# Patient Record
Sex: Female | Born: 1960 | Race: Black or African American | Hispanic: No | Marital: Married | Smoking: Never smoker
Health system: Southern US, Community
[De-identification: ages and names within clinical notes are randomized; demographics above are authoritative.]

## PROBLEM LIST (undated history)

## (undated) DIAGNOSIS — G43909 Migraine, unspecified, not intractable, without status migrainosus: Secondary | ICD-10-CM

## (undated) DIAGNOSIS — R51 Headache: Secondary | ICD-10-CM

## (undated) DIAGNOSIS — J45909 Unspecified asthma, uncomplicated: Secondary | ICD-10-CM

## (undated) DIAGNOSIS — C73 Malignant neoplasm of thyroid gland: Secondary | ICD-10-CM

## (undated) DIAGNOSIS — E7800 Pure hypercholesterolemia, unspecified: Secondary | ICD-10-CM

## (undated) DIAGNOSIS — E785 Hyperlipidemia, unspecified: Secondary | ICD-10-CM

## (undated) DIAGNOSIS — F411 Generalized anxiety disorder: Secondary | ICD-10-CM

## (undated) DIAGNOSIS — E042 Nontoxic multinodular goiter: Secondary | ICD-10-CM

## (undated) HISTORY — DX: Malignant neoplasm of thyroid gland: C73

## (undated) HISTORY — DX: Generalized anxiety disorder: F41.1

## (undated) HISTORY — DX: Nontoxic multinodular goiter: E04.2

## (undated) HISTORY — DX: Migraine, unspecified, not intractable, without status migrainosus: G43.909

## (undated) HISTORY — DX: Pure hypercholesterolemia, unspecified: E78.00

## (undated) HISTORY — DX: Headache: R51

## (undated) HISTORY — DX: Hyperlipidemia, unspecified: E78.5

---

## 1999-05-04 ENCOUNTER — Inpatient Hospital Stay (HOSPITAL_COMMUNITY): Admission: AD | Admit: 1999-05-04 | Discharge: 1999-05-04 | Payer: Self-pay | Admitting: Obstetrics and Gynecology

## 1999-05-04 ENCOUNTER — Encounter: Payer: Self-pay | Admitting: Obstetrics and Gynecology

## 1999-05-17 ENCOUNTER — Inpatient Hospital Stay (HOSPITAL_COMMUNITY): Admission: RE | Admit: 1999-05-17 | Discharge: 1999-05-20 | Payer: Self-pay | Admitting: Obstetrics and Gynecology

## 1999-06-17 ENCOUNTER — Encounter: Payer: Self-pay | Admitting: Obstetrics and Gynecology

## 1999-06-19 ENCOUNTER — Inpatient Hospital Stay (HOSPITAL_COMMUNITY): Admission: AD | Admit: 1999-06-19 | Discharge: 1999-06-25 | Payer: Self-pay | Admitting: Obstetrics and Gynecology

## 1999-08-07 ENCOUNTER — Inpatient Hospital Stay (HOSPITAL_COMMUNITY): Admission: AD | Admit: 1999-08-07 | Discharge: 1999-08-07 | Payer: Self-pay | Admitting: Obstetrics and Gynecology

## 1999-09-06 ENCOUNTER — Inpatient Hospital Stay (HOSPITAL_COMMUNITY): Admission: AD | Admit: 1999-09-06 | Discharge: 1999-09-06 | Payer: Self-pay | Admitting: Obstetrics and Gynecology

## 1999-09-08 ENCOUNTER — Inpatient Hospital Stay (HOSPITAL_COMMUNITY): Admission: AD | Admit: 1999-09-08 | Discharge: 1999-09-08 | Payer: Self-pay | Admitting: Obstetrics and Gynecology

## 1999-10-03 ENCOUNTER — Inpatient Hospital Stay (HOSPITAL_COMMUNITY): Admission: AD | Admit: 1999-10-03 | Discharge: 1999-10-03 | Payer: Self-pay | Admitting: Obstetrics & Gynecology

## 1999-11-06 ENCOUNTER — Inpatient Hospital Stay (HOSPITAL_COMMUNITY): Admission: AD | Admit: 1999-11-06 | Discharge: 1999-11-09 | Payer: Self-pay | Admitting: Obstetrics and Gynecology

## 1999-12-24 ENCOUNTER — Other Ambulatory Visit: Admission: RE | Admit: 1999-12-24 | Discharge: 1999-12-24 | Payer: Self-pay | Admitting: Obstetrics and Gynecology

## 2001-02-25 ENCOUNTER — Other Ambulatory Visit: Admission: RE | Admit: 2001-02-25 | Discharge: 2001-02-25 | Payer: Self-pay | Admitting: Obstetrics and Gynecology

## 2002-03-10 ENCOUNTER — Other Ambulatory Visit: Admission: RE | Admit: 2002-03-10 | Discharge: 2002-03-10 | Payer: Self-pay | Admitting: Obstetrics and Gynecology

## 2002-04-12 ENCOUNTER — Encounter: Payer: Self-pay | Admitting: Internal Medicine

## 2002-04-12 ENCOUNTER — Encounter: Admission: RE | Admit: 2002-04-12 | Discharge: 2002-04-12 | Payer: Self-pay | Admitting: Internal Medicine

## 2003-05-10 ENCOUNTER — Encounter: Admission: RE | Admit: 2003-05-10 | Discharge: 2003-05-10 | Payer: Self-pay | Admitting: *Deleted

## 2003-05-10 ENCOUNTER — Encounter: Payer: Self-pay | Admitting: *Deleted

## 2003-06-07 ENCOUNTER — Other Ambulatory Visit: Admission: RE | Admit: 2003-06-07 | Discharge: 2003-06-07 | Payer: Self-pay | Admitting: Obstetrics and Gynecology

## 2003-07-15 ENCOUNTER — Other Ambulatory Visit: Admission: RE | Admit: 2003-07-15 | Discharge: 2003-07-15 | Payer: Self-pay | Admitting: Diagnostic Radiology

## 2003-09-14 HISTORY — PX: THYROIDECTOMY: SHX17

## 2003-09-30 ENCOUNTER — Encounter (INDEPENDENT_AMBULATORY_CARE_PROVIDER_SITE_OTHER): Payer: Self-pay | Admitting: Specialist

## 2003-09-30 ENCOUNTER — Ambulatory Visit (HOSPITAL_COMMUNITY): Admission: RE | Admit: 2003-09-30 | Discharge: 2003-10-01 | Payer: Self-pay | Admitting: General Surgery

## 2003-10-15 HISTORY — PX: TUBAL LIGATION: SHX77

## 2003-12-28 ENCOUNTER — Ambulatory Visit (HOSPITAL_COMMUNITY): Admission: RE | Admit: 2003-12-28 | Discharge: 2003-12-28 | Payer: Self-pay | Admitting: Obstetrics and Gynecology

## 2004-01-04 ENCOUNTER — Other Ambulatory Visit: Admission: RE | Admit: 2004-01-04 | Discharge: 2004-01-04 | Payer: Self-pay | Admitting: Obstetrics and Gynecology

## 2004-03-20 ENCOUNTER — Inpatient Hospital Stay (HOSPITAL_COMMUNITY): Admission: AD | Admit: 2004-03-20 | Discharge: 2004-03-20 | Payer: Self-pay | Admitting: Obstetrics and Gynecology

## 2004-03-21 ENCOUNTER — Inpatient Hospital Stay (HOSPITAL_COMMUNITY): Admission: AD | Admit: 2004-03-21 | Discharge: 2004-03-21 | Payer: Self-pay | Admitting: Obstetrics and Gynecology

## 2004-06-13 ENCOUNTER — Inpatient Hospital Stay (HOSPITAL_COMMUNITY): Admission: AD | Admit: 2004-06-13 | Discharge: 2004-06-13 | Payer: Self-pay | Admitting: Obstetrics and Gynecology

## 2004-07-25 ENCOUNTER — Inpatient Hospital Stay (HOSPITAL_COMMUNITY): Admission: RE | Admit: 2004-07-25 | Discharge: 2004-07-28 | Payer: Self-pay | Admitting: Obstetrics and Gynecology

## 2004-07-25 ENCOUNTER — Encounter (INDEPENDENT_AMBULATORY_CARE_PROVIDER_SITE_OTHER): Payer: Self-pay | Admitting: *Deleted

## 2004-09-10 ENCOUNTER — Other Ambulatory Visit: Admission: RE | Admit: 2004-09-10 | Discharge: 2004-09-10 | Payer: Self-pay | Admitting: Obstetrics and Gynecology

## 2005-01-21 ENCOUNTER — Ambulatory Visit: Payer: Self-pay | Admitting: Endocrinology

## 2005-01-23 ENCOUNTER — Ambulatory Visit: Payer: Self-pay | Admitting: Internal Medicine

## 2005-02-15 ENCOUNTER — Ambulatory Visit (HOSPITAL_COMMUNITY): Admission: RE | Admit: 2005-02-15 | Discharge: 2005-02-15 | Payer: Self-pay | Admitting: Obstetrics and Gynecology

## 2005-02-15 ENCOUNTER — Encounter (INDEPENDENT_AMBULATORY_CARE_PROVIDER_SITE_OTHER): Payer: Self-pay | Admitting: *Deleted

## 2005-09-17 ENCOUNTER — Other Ambulatory Visit: Admission: RE | Admit: 2005-09-17 | Discharge: 2005-09-17 | Payer: Self-pay | Admitting: Obstetrics and Gynecology

## 2006-02-10 ENCOUNTER — Encounter: Admission: RE | Admit: 2006-02-10 | Discharge: 2006-03-05 | Payer: Self-pay | Admitting: Specialist

## 2006-05-14 ENCOUNTER — Ambulatory Visit: Payer: Self-pay | Admitting: Endocrinology

## 2006-05-21 ENCOUNTER — Ambulatory Visit: Payer: Self-pay | Admitting: Endocrinology

## 2006-06-30 ENCOUNTER — Ambulatory Visit: Payer: Self-pay | Admitting: Endocrinology

## 2006-07-01 ENCOUNTER — Ambulatory Visit: Payer: Self-pay | Admitting: Endocrinology

## 2007-06-13 ENCOUNTER — Encounter: Payer: Self-pay | Admitting: Endocrinology

## 2007-08-12 ENCOUNTER — Ambulatory Visit: Payer: Self-pay | Admitting: Endocrinology

## 2007-08-12 LAB — CONVERTED CEMR LAB
AST: 17 units/L (ref 0–37)
Albumin: 3.6 g/dL (ref 3.5–5.2)
Alkaline Phosphatase: 50 units/L (ref 39–117)
BUN: 10 mg/dL (ref 6–23)
Basophils Relative: 0.5 % (ref 0.0–1.0)
Calcium: 8.6 mg/dL (ref 8.4–10.5)
Cholesterol: 172 mg/dL (ref 0–200)
Creatinine, Ser: 0.8 mg/dL (ref 0.4–1.2)
Eosinophils Absolute: 0.2 10*3/uL (ref 0.0–0.6)
Glucose, Bld: 101 mg/dL — ABNORMAL HIGH (ref 70–99)
HDL: 40.8 mg/dL (ref >39.0)
Iron: 39 ug/dL — ABNORMAL LOW (ref 42–145)
Ketones, ur: NEGATIVE mg/dL
Leukocytes, UA: NEGATIVE
Lymphocytes Relative: 39.3 % (ref 12.0–46.0)
MCHC: 33.7 g/dL (ref 30.0–36.0)
Monocytes Absolute: 0.3 10*3/uL (ref 0.2–0.7)
Monocytes Relative: 7.9 % (ref 3.0–11.0)
Neutro Abs: 2 10*3/uL (ref 1.4–7.7)
Potassium: 3.9 meq/L (ref 3.5–5.1)
RBC: 4.03 M/uL (ref 3.87–5.11)
RDW: 15.3 % — ABNORMAL HIGH (ref 11.5–14.6)
Sodium: 137 meq/L (ref 135–145)
Total Bilirubin: 0.7 mg/dL (ref 0.3–1.2)
Total Protein: 7.4 g/dL (ref 6.0–8.3)
Urine Glucose: NEGATIVE mg/dL
Urobilinogen, UA: 0.2 (ref 0.0–1.0)
WBC: 4.2 10*3/uL — ABNORMAL LOW (ref 4.5–10.5)

## 2007-08-19 ENCOUNTER — Encounter: Payer: Self-pay | Admitting: Endocrinology

## 2007-08-19 DIAGNOSIS — C73 Malignant neoplasm of thyroid gland: Secondary | ICD-10-CM

## 2007-08-19 DIAGNOSIS — E042 Nontoxic multinodular goiter: Secondary | ICD-10-CM | POA: Insufficient documentation

## 2007-08-19 DIAGNOSIS — E119 Type 2 diabetes mellitus without complications: Secondary | ICD-10-CM

## 2007-08-19 DIAGNOSIS — E785 Hyperlipidemia, unspecified: Secondary | ICD-10-CM

## 2007-08-19 HISTORY — DX: Nontoxic multinodular goiter: E04.2

## 2007-08-19 HISTORY — DX: Hyperlipidemia, unspecified: E78.5

## 2007-08-19 HISTORY — DX: Malignant neoplasm of thyroid gland: C73

## 2007-08-20 ENCOUNTER — Ambulatory Visit: Payer: Self-pay | Admitting: Endocrinology

## 2007-08-20 DIAGNOSIS — D509 Iron deficiency anemia, unspecified: Secondary | ICD-10-CM

## 2007-08-20 DIAGNOSIS — R51 Headache: Secondary | ICD-10-CM

## 2007-08-20 DIAGNOSIS — R519 Headache, unspecified: Secondary | ICD-10-CM | POA: Insufficient documentation

## 2007-08-20 DIAGNOSIS — R635 Abnormal weight gain: Secondary | ICD-10-CM | POA: Insufficient documentation

## 2007-08-20 DIAGNOSIS — R9431 Abnormal electrocardiogram [ECG] [EKG]: Secondary | ICD-10-CM

## 2007-08-20 HISTORY — DX: Headache: R51

## 2007-09-03 ENCOUNTER — Ambulatory Visit: Payer: Self-pay

## 2007-09-03 ENCOUNTER — Encounter: Payer: Self-pay | Admitting: Endocrinology

## 2007-09-09 ENCOUNTER — Ambulatory Visit: Payer: Self-pay

## 2007-10-01 ENCOUNTER — Telehealth (INDEPENDENT_AMBULATORY_CARE_PROVIDER_SITE_OTHER): Payer: Self-pay | Admitting: *Deleted

## 2008-07-07 ENCOUNTER — Telehealth: Payer: Self-pay | Admitting: Endocrinology

## 2008-11-08 ENCOUNTER — Ambulatory Visit: Payer: Self-pay | Admitting: Endocrinology

## 2008-11-09 LAB — CONVERTED CEMR LAB
AST: 24 units/L (ref 0–37)
Albumin: 3.9 g/dL (ref 3.5–5.2)
Alkaline Phosphatase: 47 units/L (ref 39–117)
BUN: 14 mg/dL (ref 6–23)
Basophils Absolute: 0 10*3/uL (ref 0.0–0.1)
Basophils Relative: 0.5 % (ref 0.0–3.0)
CO2: 29 meq/L (ref 19–32)
Chloride: 103 meq/L (ref 96–112)
GFR calc Af Amer: 77 mL/min
Glucose, Bld: 101 mg/dL — ABNORMAL HIGH (ref 70–99)
Hemoglobin: 11.7 g/dL — ABNORMAL LOW (ref 12.0–15.0)
Ketones, ur: NEGATIVE mg/dL
Leukocytes, UA: NEGATIVE
MCHC: 33.7 g/dL (ref 30.0–36.0)
MCV: 86.8 fL (ref 78.0–100.0)
Monocytes Absolute: 0.5 10*3/uL (ref 0.1–1.0)
Mucus, UA: NEGATIVE
Neutrophils Relative %: 52.4 % (ref 43.0–77.0)
Platelets: 222 10*3/uL (ref 150–400)
RBC / HPF: NONE SEEN
RDW: 14 % (ref 11.5–14.6)
TSH: 1.96 microintl units/mL (ref 0.35–5.50)
Total CHOL/HDL Ratio: 3.4
Total Protein, Urine: NEGATIVE mg/dL
Urine Glucose: NEGATIVE mg/dL
Urobilinogen, UA: 0.2 (ref 0.0–1.0)
VLDL: 8 mg/dL (ref 0–40)

## 2008-11-29 ENCOUNTER — Ambulatory Visit: Payer: Self-pay | Admitting: Endocrinology

## 2008-11-29 DIAGNOSIS — D500 Iron deficiency anemia secondary to blood loss (chronic): Secondary | ICD-10-CM

## 2008-12-19 ENCOUNTER — Encounter: Admission: RE | Admit: 2008-12-19 | Discharge: 2008-12-19 | Payer: Self-pay | Admitting: Endocrinology

## 2009-01-27 ENCOUNTER — Telehealth: Payer: Self-pay | Admitting: Endocrinology

## 2009-08-06 ENCOUNTER — Emergency Department (HOSPITAL_COMMUNITY): Admission: EM | Admit: 2009-08-06 | Discharge: 2009-08-06 | Payer: Self-pay | Admitting: Emergency Medicine

## 2009-12-06 ENCOUNTER — Ambulatory Visit: Payer: Self-pay | Admitting: Endocrinology

## 2009-12-06 LAB — CONVERTED CEMR LAB
AST: 19 units/L (ref 0–37)
Albumin: 3.7 g/dL (ref 3.5–5.2)
Alkaline Phosphatase: 38 units/L — ABNORMAL LOW (ref 39–117)
Basophils Relative: 0.3 % (ref 0.0–3.0)
Cholesterol: 183 mg/dL (ref 0–200)
HCT: 37.7 % (ref 36.0–46.0)
Lymphocytes Relative: 39 % (ref 12.0–46.0)
Monocytes Relative: 10.8 % (ref 3.0–12.0)
Neutro Abs: 2.3 10*3/uL (ref 1.4–7.7)
Neutrophils Relative %: 47.1 % (ref 43.0–77.0)
RBC: 4.16 M/uL (ref 3.87–5.11)
Total CHOL/HDL Ratio: 3
Total Protein: 7.6 g/dL (ref 6.0–8.3)
Triglycerides: 60 mg/dL (ref 0.0–149.0)
VLDL: 12 mg/dL (ref 0.0–40.0)

## 2009-12-11 ENCOUNTER — Telehealth: Payer: Self-pay | Admitting: Endocrinology

## 2009-12-11 ENCOUNTER — Ambulatory Visit: Payer: Self-pay | Admitting: Endocrinology

## 2009-12-11 DIAGNOSIS — M25519 Pain in unspecified shoulder: Secondary | ICD-10-CM | POA: Insufficient documentation

## 2009-12-18 ENCOUNTER — Ambulatory Visit: Payer: Self-pay | Admitting: Endocrinology

## 2009-12-26 ENCOUNTER — Ambulatory Visit: Payer: Self-pay | Admitting: Cardiology

## 2009-12-26 ENCOUNTER — Ambulatory Visit (HOSPITAL_COMMUNITY): Admission: RE | Admit: 2009-12-26 | Discharge: 2009-12-26 | Payer: Self-pay | Admitting: Endocrinology

## 2009-12-26 ENCOUNTER — Ambulatory Visit: Payer: Self-pay

## 2009-12-26 ENCOUNTER — Encounter: Payer: Self-pay | Admitting: Endocrinology

## 2010-03-09 ENCOUNTER — Telehealth: Payer: Self-pay | Admitting: Endocrinology

## 2010-05-04 ENCOUNTER — Ambulatory Visit: Payer: Self-pay | Admitting: Endocrinology

## 2010-05-04 DIAGNOSIS — E7800 Pure hypercholesterolemia, unspecified: Secondary | ICD-10-CM

## 2010-05-04 DIAGNOSIS — F411 Generalized anxiety disorder: Secondary | ICD-10-CM

## 2010-05-04 HISTORY — DX: Pure hypercholesterolemia, unspecified: E78.00

## 2010-05-04 HISTORY — DX: Generalized anxiety disorder: F41.1

## 2010-05-04 LAB — CONVERTED CEMR LAB
Alkaline Phosphatase: 45 units/L (ref 39–117)
BUN: 16 mg/dL (ref 6–23)
Bilirubin, Direct: 0.2 mg/dL (ref 0.0–0.3)
CO2: 35 meq/L — ABNORMAL HIGH (ref 19–32)
Calcium: 9 mg/dL (ref 8.4–10.5)
Chloride: 103 meq/L (ref 96–112)
Creatinine, Ser: 0.9 mg/dL (ref 0.4–1.2)
GFR calc non Af Amer: 83.95 mL/min (ref >60)
Glucose, Bld: 109 mg/dL — ABNORMAL HIGH (ref 70–99)
HDL: 47.6 mg/dL (ref >39.00)
LDL Cholesterol: 84 mg/dL (ref 0–99)
Total CHOL/HDL Ratio: 3
Total Protein: 7.4 g/dL (ref 6.0–8.3)
VLDL: 13.8 mg/dL (ref 0.0–40.0)

## 2010-05-10 ENCOUNTER — Ambulatory Visit: Payer: Self-pay | Admitting: Licensed Clinical Social Worker

## 2010-05-15 ENCOUNTER — Ambulatory Visit: Payer: Self-pay | Admitting: Licensed Clinical Social Worker

## 2010-05-21 ENCOUNTER — Ambulatory Visit: Payer: Self-pay | Admitting: Licensed Clinical Social Worker

## 2010-05-28 ENCOUNTER — Ambulatory Visit: Payer: Self-pay | Admitting: Licensed Clinical Social Worker

## 2010-06-05 ENCOUNTER — Ambulatory Visit: Payer: Self-pay | Admitting: Licensed Clinical Social Worker

## 2010-06-07 ENCOUNTER — Ambulatory Visit: Payer: Self-pay | Admitting: Licensed Clinical Social Worker

## 2010-06-14 ENCOUNTER — Encounter: Payer: Self-pay | Admitting: Endocrinology

## 2010-06-14 ENCOUNTER — Ambulatory Visit: Payer: Self-pay | Admitting: Licensed Clinical Social Worker

## 2010-06-21 ENCOUNTER — Ambulatory Visit: Payer: Self-pay | Admitting: Licensed Clinical Social Worker

## 2010-06-28 ENCOUNTER — Ambulatory Visit: Payer: Self-pay | Admitting: Licensed Clinical Social Worker

## 2010-07-02 ENCOUNTER — Telehealth (INDEPENDENT_AMBULATORY_CARE_PROVIDER_SITE_OTHER): Payer: Self-pay | Admitting: *Deleted

## 2010-07-05 ENCOUNTER — Ambulatory Visit: Payer: Self-pay | Admitting: Licensed Clinical Social Worker

## 2010-07-16 ENCOUNTER — Ambulatory Visit: Payer: Self-pay | Admitting: Licensed Clinical Social Worker

## 2010-07-24 ENCOUNTER — Ambulatory Visit: Payer: Self-pay | Admitting: Licensed Clinical Social Worker

## 2010-07-30 ENCOUNTER — Ambulatory Visit: Payer: Self-pay | Admitting: Licensed Clinical Social Worker

## 2010-08-07 ENCOUNTER — Ambulatory Visit: Payer: Self-pay | Admitting: Licensed Clinical Social Worker

## 2010-08-16 ENCOUNTER — Ambulatory Visit: Payer: Self-pay | Admitting: Licensed Clinical Social Worker

## 2010-08-22 ENCOUNTER — Ambulatory Visit: Payer: Self-pay | Admitting: Licensed Clinical Social Worker

## 2010-09-03 ENCOUNTER — Ambulatory Visit: Payer: Self-pay | Admitting: Licensed Clinical Social Worker

## 2010-11-04 ENCOUNTER — Encounter: Payer: Self-pay | Admitting: Obstetrics and Gynecology
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
## 2010-11-13 NOTE — Assessment & Plan Note (Signed)
Summary: FOLLOW UP-LB   Vital Signs:  Patient profile:   50 year old female Height:      64 inches (162.56 cm) Weight:      256.25 pounds (116.48 kg) BMI:     44.14 O2 Sat:      99 % on Room air Temp:     96.5 degrees F (35.83 degrees C) oral Pulse rate:   88 / minute BP sitting:   132 / 82  (left arm) Cuff size:   large  Vitals Entered By: Josph Macho RMA (December 11, 2009 8:09 AM)  O2 Flow:  Room air CC: Follow-up visit/ CF Is Patient Diabetic? Yes   CC:  Follow-up visit/ CF.  History of Present Illness: here for regular wellness examination.  she's feeling pretty well in general, and does not drink or smoke.   Current Medications (verified): 1)  Levoxyl 75 Mcg  Tabs (Levothyroxine Sodium) .... Take 1 By Mouth Qd 2)  Simvastatin 40 Mg Tabs (Simvastatin) .... Qhs 3)  Ketoprofen 75 Mg Caps (Ketoprofen) .... Three Times A Day As Needed Headache 4)  Hydrochlorothiazide 25 Mg Tabs (Hydrochlorothiazide) .Marland Kitchen.. 1 Daily  Allergies (verified): No Known Drug Allergies  Family History: Reviewed history from 08/20/2007 and no changes required. no cancer.  Social History: Reviewed history from 08/20/2007 and no changes required. she works in Photographer.  She is married.  Review of Systems       The patient complains of weight gain.  The patient denies fever, vision loss, decreased hearing, chest pain, syncope, prolonged cough, headaches, abdominal pain, melena, hematochezia, severe indigestion/heartburn, hematuria, suspicious skin lesions, and depression.    Physical Exam  General:  obese.  no distress  Head:  head: no deformity eyes: no periorbital swelling, no proptosis external nose and ears are normal mouth: no lesion seen Neck:  a healed scar is present.  i do not appreciate a nodule in the thyroid or elsewhere in the neck  Breasts:  sees gyn  Abdomen:  abdomen is soft, nontender.  no hepatosplenomegaly.   not distended.  no hernia  Rectal:  sees  gyn  Genitalia:  sees gyn  Msk:  muscle bulk and strength are grossly normal.  no obvious joint swelling.  gait is normal and steady there is a ganglion cyst at the right ring finger, flexor aspect, near the mcp joint Pulses:  dorsalis pedis intact bilat.  no carotid bruit  Extremities:  no deformity.  no ulcer on the feet.  feet are of normal color and temp.  no edema  Neurologic:  cn 2-12 grossly intact.   readily moves all 4's.   sensation is intact to touch on the feet  Skin:  normal texture and temp.  no rash.  not diaphoretic  Cervical Nodes:  No significant adenopathy.  Psych:  Alert and cooperative; normal mood and affect; normal attention span and concentration.   Additional Exam:  SEPARATE EVALUATION FOLLOWS--EACH PROBLEM HERE IS NEW, NOT RESPONDING TO TREATMENT, OR POSES SIGNIFICANT RISK TO THE PATIENT'S HEALTH: pt is planning to travel to the [place] later this year. she has regained the weight she lost with phentermine last year abnormal ecg is noted today. PAST MEDICAL HISTORY reviewed and up to date today REVIEW OF SYSTEMS: denies sob PHYSICAL EXAMINATION: clear to auscultation.  no respiratory distress reg rate and rhythm, no murmur LAB/XRAY RESULTS: LDL Cholesterol      [H]  621 mg/dL    ecg is reviewed IMPRESSION: dyslipidemia abnormal ecg.  she is at risk for valvular disease. travel to developing country later this year. PLAN: see instructions   Impression & Recommendations:  Problem # 1:  ROUTINE GENERAL MEDICAL EXAM@HEALTH  CARE FACL (ICD-V70.0)  Medications Added to Medication List This Visit: 1)  Hydrochlorothiazide 25 Mg Tabs (Hydrochlorothiazide) .Marland Kitchen.. 1 daily 2)  Hydrochlorothiazide 25 Mg Tabs (Hydrochlorothiazide) .... 1/2 tab daily 3)  Ondansetron Hcl 4 Mg Tabs (Ondansetron hcl) .Marland Kitchen.. 1 every 4 hrs as needed nausea 4)  Ciprofloxacin Hcl 500 Mg Tabs (Ciprofloxacin hcl) .Marland Kitchen.. 1 two times a day.  take if traveler's diarrhea  Other  Orders: EKG w/ Interpretation (93000) Echo Referral (Echo) Est. Patient Level III (02725) Est. Patient 40-64 years (36644)  Preventive Care Screening  Last Tetanus Booster:    Date:  10/14/1997    Results:  Historical      gyn is dr Rana Snare, who does mammography   Patient Instructions: 1)  start simvastatin 40 mg at bedtime. 2)  in 1 month, come in for blood tests:  lipids 272.0  liver v58.69  a1c 250.00  tsh 250.00  bmet 250.00 microalbumin 250.00 3)  tests are being ordered for you today.  a few days after the test(s), please call [phone] to hear your test results. 4)  check "echo" (an easy heart test).   5)  for your trip to the [place] later this year, here are 2 prescriptions:  cipro if you get diarrhea, and ondansetron as needed for nausea. 6)  we discussed the recommendations of the preventive services task force Prescriptions: CIPROFLOXACIN HCL 500 MG TABS (CIPROFLOXACIN HCL) 1 two times a day.  take if traveler's diarrhea  #14 x 0   Entered and Authorized by:   Minus Breeding MD   Signed by:   Minus Breeding MD on 12/11/2009   Method used:   Print then Give to Patient   RxID:   [id number] ONDANSETRON HCL 4 MG TABS (ONDANSETRON HCL) 1 every 4 hrs as needed nausea  #36 x 0   Entered and Authorized by:   Minus Breeding MD   Signed by:   Minus Breeding MD on 12/11/2009   Method used:   Print then Give to Patient   RxID:   [id number]   Preventive Care Screening  Last Tetanus Booster:    Date:  10/14/1997    Results:  Historical

## 2010-11-13 NOTE — Letter (Signed)
Summary: [facility]  [facility]   Imported By: Sherian Rein 06/25/2010 15:04:00  _____________________________________________________________________  External Attachment:    Type:   Image     Comment:   External Document

## 2010-11-13 NOTE — Progress Notes (Signed)
----   Converted from flag ---- ---- 12/11/2009 8:42 AM, Minus Breeding MD wrote: paper chart please ? tetanus date ------------------------------  Phone Note Outgoing Call   Summary of Call: Paperchart requested. Initial call taken by: Josph Macho RMA,  December 11, 2009 8:46 AM  Follow-up for Phone Call        1999 was the last tetanus. Follow-up by: Josph Macho RMA,  December 11, 2009 9:34 AM  Additional Follow-up for Phone Call Additional follow up Details #1::        i called pt.  she will come in tomorrow for td, and to pick up her prescriptions. Additional Follow-up by: Minus Breeding MD,  December 11, 2009 9:45 AM     Appended Document:  put prescriptions in the cabinet.

## 2010-11-13 NOTE — Assessment & Plan Note (Signed)
Summary: TETANUS INJ/SAE /NWS  Nurse Visit   Allergies: No Known Drug Allergies  Immunizations Administered:  Tetanus Vaccine:    Vaccine Type: Tdap    Site: left deltoid    Mfr: GlaxoSmithKline    Dose: 0.5 ml    Route: IM    Given by: Sydell Axon    Exp. Date: 01/06/2012    Lot #: [id number]    VIS given: 09/01/07 version given December 18, 2009.  Orders Added: 1)  Tdap => 50yrs IM [90715] 2)  Admin 1st Vaccine [42595]

## 2010-11-13 NOTE — Progress Notes (Signed)
  Phone Note Other Incoming   Request: Send information Summary of Call: Request for records received from [facility]. Request forwarded to [facility].

## 2010-11-13 NOTE — Progress Notes (Signed)
  Phone Note Refill Request Message from:  Fax from Pharmacy on Mar 09, 2010 3:04 PM  Refills Requested: Medication #1:  LEVOXYL 75 MCG  TABS take 1 by mouth qd   Dosage confirmed as above?Dosage Confirmed Initial call taken by: Josph Macho RMA,  Mar 09, 2010 3:05 PM    Prescriptions: LEVOXYL 75 MCG  TABS (LEVOTHYROXINE SODIUM) take 1 by mouth qd  #90 x 2   Entered by:   Josph Macho RMA   Authorized by:   Minus Breeding MD   Signed by:   Josph Macho RMA on 03/09/2010   Method used:   Electronically to        [facility] (mail-order)             ,          Ph: [phone]       Fax: [phone]   RxID:   [id number]

## 2010-11-19 ENCOUNTER — Ambulatory Visit (INDEPENDENT_AMBULATORY_CARE_PROVIDER_SITE_OTHER): Payer: 59 | Admitting: Licensed Clinical Social Worker

## 2010-11-19 DIAGNOSIS — F411 Generalized anxiety disorder: Secondary | ICD-10-CM

## 2010-11-19 DIAGNOSIS — F4310 Post-traumatic stress disorder, unspecified: Secondary | ICD-10-CM

## 2010-11-19 DIAGNOSIS — F331 Major depressive disorder, recurrent, moderate: Secondary | ICD-10-CM

## 2010-11-26 ENCOUNTER — Ambulatory Visit (INDEPENDENT_AMBULATORY_CARE_PROVIDER_SITE_OTHER): Payer: 59 | Admitting: Licensed Clinical Social Worker

## 2010-11-26 DIAGNOSIS — F331 Major depressive disorder, recurrent, moderate: Secondary | ICD-10-CM

## 2010-11-26 DIAGNOSIS — F411 Generalized anxiety disorder: Secondary | ICD-10-CM

## 2010-11-26 DIAGNOSIS — F4310 Post-traumatic stress disorder, unspecified: Secondary | ICD-10-CM

## 2010-12-07 ENCOUNTER — Ambulatory Visit: Payer: 59 | Admitting: Licensed Clinical Social Worker

## 2010-12-07 ENCOUNTER — Ambulatory Visit (INDEPENDENT_AMBULATORY_CARE_PROVIDER_SITE_OTHER): Payer: 59 | Admitting: Licensed Clinical Social Worker

## 2010-12-07 DIAGNOSIS — F4310 Post-traumatic stress disorder, unspecified: Secondary | ICD-10-CM

## 2010-12-07 DIAGNOSIS — F331 Major depressive disorder, recurrent, moderate: Secondary | ICD-10-CM

## 2010-12-07 DIAGNOSIS — F411 Generalized anxiety disorder: Secondary | ICD-10-CM

## 2010-12-17 ENCOUNTER — Ambulatory Visit (INDEPENDENT_AMBULATORY_CARE_PROVIDER_SITE_OTHER): Payer: 59 | Admitting: Licensed Clinical Social Worker

## 2010-12-17 DIAGNOSIS — F411 Generalized anxiety disorder: Secondary | ICD-10-CM

## 2010-12-17 DIAGNOSIS — F4310 Post-traumatic stress disorder, unspecified: Secondary | ICD-10-CM

## 2010-12-17 DIAGNOSIS — F331 Major depressive disorder, recurrent, moderate: Secondary | ICD-10-CM

## 2010-12-31 ENCOUNTER — Ambulatory Visit (INDEPENDENT_AMBULATORY_CARE_PROVIDER_SITE_OTHER): Payer: 59 | Admitting: Licensed Clinical Social Worker

## 2010-12-31 DIAGNOSIS — F411 Generalized anxiety disorder: Secondary | ICD-10-CM

## 2010-12-31 DIAGNOSIS — F4310 Post-traumatic stress disorder, unspecified: Secondary | ICD-10-CM

## 2010-12-31 DIAGNOSIS — F331 Major depressive disorder, recurrent, moderate: Secondary | ICD-10-CM

## 2011-01-14 ENCOUNTER — Ambulatory Visit: Payer: 59 | Admitting: Licensed Clinical Social Worker

## 2011-01-17 LAB — COMPREHENSIVE METABOLIC PANEL
AST: 24 U/L (ref 0–37)
BUN: 18 mg/dL (ref 6–23)
GFR calc Af Amer: >60 mL/min (ref >60)
Glucose, Bld: 122 mg/dL — ABNORMAL HIGH (ref 70–99)
Potassium: 3.6 mEq/L (ref 3.5–5.1)
Sodium: 136 mEq/L (ref 135–145)
Total Bilirubin: 0.9 mg/dL (ref 0.3–1.2)

## 2011-01-18 ENCOUNTER — Other Ambulatory Visit: Payer: Self-pay | Admitting: Endocrinology

## 2011-01-25 ENCOUNTER — Ambulatory Visit (INDEPENDENT_AMBULATORY_CARE_PROVIDER_SITE_OTHER): Payer: 59 | Admitting: Licensed Clinical Social Worker

## 2011-01-25 DIAGNOSIS — F4310 Post-traumatic stress disorder, unspecified: Secondary | ICD-10-CM

## 2011-01-25 DIAGNOSIS — F331 Major depressive disorder, recurrent, moderate: Secondary | ICD-10-CM

## 2011-01-25 DIAGNOSIS — F411 Generalized anxiety disorder: Secondary | ICD-10-CM

## 2011-02-19 ENCOUNTER — Ambulatory Visit (INDEPENDENT_AMBULATORY_CARE_PROVIDER_SITE_OTHER): Payer: 59 | Admitting: Licensed Clinical Social Worker

## 2011-02-19 DIAGNOSIS — F331 Major depressive disorder, recurrent, moderate: Secondary | ICD-10-CM

## 2011-02-19 DIAGNOSIS — F411 Generalized anxiety disorder: Secondary | ICD-10-CM

## 2011-02-19 DIAGNOSIS — F4310 Post-traumatic stress disorder, unspecified: Secondary | ICD-10-CM

## 2011-03-01 NOTE — Op Note (Signed)
NAME:  Miranda Dudley, Miranda Dudley                           ACCOUNT NO.:  [id number]   MEDICAL RECORD NO.:  [id number]                   PATIENT TYPE:  OIB   LOCATION:  6704                                 FACILITY:  [facility]   PHYSICIAN:  Jimmye Norman III, M.D.               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   PROCEDURE:  Left thyroid lobectomy with isthmusectomy.   SURGEON:  Jimmye Norman, M.D.   ASSISTANT:  Sharlet Salina T. Hoxworth, M.D.   ANESTHESIA:  General endotracheal anesthesia.   ESTIMATED BLOOD LOSS:  Less than 20 mL.   COMPLICATIONS:  None, condition stable.   INDICATIONS FOR PROCEDURE:  The patient is a 50 year old who had been  diagnosed with a left thyroid mass, who comes in now for lobectomy.   FINDINGS:  The patient had a 2 to 3-cm nodule on the left mid to lower pole  of her left thyroid, which on frozen section demonstrated follicular  pattern, likely hyperplasia, possible adenoma, no evidence of malignancy.   DESCRIPTION OF PROCEDURE:  The patient was taken to the operating room and  placed on the table in the supine position. After an adequate endotracheal  anesthetic was administered, a roll was placed underneath the shoulders. She  was placed in reverse Trendelenburg position. She was prepped and draped in  the usual sterile manner exposing her neck.   In the next  to last skin  fold in her neck, a suture was used to mark the  neck, and subsequently a 5-cm transverse curvilinear incision was made at  the second to last skin fold. This was taken down into the subcutaneous  tissue and then through the platysmus layer. We made a plane superiorly and  inferiorly in the  platysmus layer or just below that up to the bridging  veins from the anterior jugulars.   We subsequently dissected between the strap muscles, the sternohyoid and  the sterno hyothyroid muscles down to the layer of the thyroid. We dissected  out the left side only  but palpated the right side where no palpable masses  were noted.   We used a Green retractor  initially and then subsequent an [employer]  retractor in order to retract the muscle laterally as we exposed the lateral  and posterior aspects of the thyroid  gland. We  initially mobilized the  superior pole of the thyroid gland, getting down to superior  thyroid  vessels. We used a right-angle clamp in order to control those vessels and  subsequently place a 2-0 Vicryl tie.   We then dissected around the medial and  posterior aspect of the gland on  middle and posterior aspects, anteriorly  retracting it as we took down its  fibrous adhesions posteriorly. Once we got to the bridging middle thyroid  vessels, these were taken between  4-0 and 3-0 Vicryl ties. Clips were used  along the thyroid side of the vessels.   Inferiorly we got the inferior  thyroid vessels between  a right-angle clamp  and we tied those with a 3-0 Vicryl tie. As we had freed these up  completely,  it was only attached more posteriorly, however, we were far  away from the recurrent laryngeal nerve and had minimal difficulty with  that. We stayed very closed to the gland, making sure that we dissected away  the parathyroid inferiorly and superiorly. We also stayed  very closed to  the gland making sure that the recurrent laryngeal nerve was not involved.   As we came across the middle portion of the trachea detaching the thyroid  from it, we were able to get to just the right of the isthmus or use cautery  to come across the gland. Once we completely transected it, we cauterized  the bare edge of the thyroid gland which was not  bleeding much at all. We   then irrigated with saline and  waited for pathology prior to closure.   Once we had confirmation that this was not a malignancy or being concerned  that a complete thyroidectomy was not necessary, we closed in several  layers. The strap muscles were reapproximated using interrupted simple  stitches of 3-0 Vicryl. The platysmus layer was reapproximated using  buried  3-0 Vicryl sutures. Then the skin was closed using interrupted,  approximately five 4-0 nylon sutures with intervening Steri-Strips. A  sterile dressing was applied including a [place]'s wrap with safety pins.                                               Kathrin Ruddy, M.D.    JW/MEDQ  D:  09/30/2003  T:  10/02/2003  Job:  [id number]

## 2011-03-01 NOTE — H&P (Signed)
NAME:  Miranda Dudley, Miranda Dudley                 ACCOUNT NO.:  [id number]   MEDICAL RECORD NO.:  [id number]          PATIENT TYPE:  AMB   LOCATION:  SDC                           FACILITY:  [facility]   PHYSICIAN:  Dineen Kid. Rana Snare, M.D.    DATE OF BIRTH:  January 12, 1961   DATE OF ADMISSION:  DATE OF DISCHARGE:                                HISTORY & PHYSICAL   HISTORY OF PRESENT ILLNESS:  The patient is a 50 year old G5, P2, A3, who  had been having problems with cramping and menorrhagia, where she will  change a pad every two hours.  This has been getting worse over the last  several months.  She is getting some improvement with Motrin.  She underwent  a sonohysterogram on January 22, 2005 that showed several small fibroids, the  largest measuring 1.7 cm in size; however, it also shows a 9 mm polypoid  mass within the endometrial cavity, suspicious for a polyp.  She presents  for definitive surgical evaluation and treatment of this.   PAST MEDICAL HISTORY:  1.  Significant for history of two cervical cerclages and one abdominal      cerclage.  2.  She has had two cesarean sections including tubal ligation and removal      of the abdominal cerclage in October of 2005.   ALLERGIES:  1.  She has a LATEX sensitivity.  2.  AMPICILLIN.   MEDICATIONS:  She is on Levoxyl 75 mcg a day.   PHYSICAL EXAMINATION:  VITAL SIGNS:  Her blood pressure is 118/88.  HEART:  Regular rate and rhythm.  LUNGS:  Clear to auscultation bilaterally.  ABDOMEN:  Obese, nontender.  PELVIC:  By ultrasound the uterus is 9.4 x 3.6 x 7 cm in size with a 9 mm  polypoid mass seen within the endometrial cavity.   IMPRESSION/PLAN:  1.  Abnormal uterine bleeding.  2.  Endometrial mass consistent with a polyp.  3.  She also has intramural fibroids.   PLAN:  Hysteroscopy/D&C for evaluation and removal of this.  Risks and  benefits were discussed at length which include but not limited to, risks of  infection, bleeding, damage to uterus,  tubes, ovaries, bowel, bladder. the  possibility that this may not alleviate the bleeding or could recur.  She  does give her informed consent and wishes to proceed.      DCL/MEDQ  D:  02/14/2005  T:  02/14/2005  Job:  [id number]

## 2011-03-01 NOTE — H&P (Signed)
[facility]  Patient:    Miranda Dudley                    MRN: [id number] Adm. Date:  [id number] Attending:  Trevor Iha                         History and Physical  HISTORY OF PRESENT ILLNESS:  Miranda Dudley is a 50 year old G4, P1, A2 with no living children.  She has a history of recurrent miscarriages.  This pregnancy had an abdominal cerclage placed.  She otherwise had a complicated pregnancy by preterm labor, on bed rest, cerclage and terbutaline.  She presents today in labor, with retractions every 3-5 minutes at 37-6/7 weeks.  Her estimated date of confinement is November 24, 1999.  PAST MEDICAL HISTORY:  Negative.  PAST SURGICAL HISTORY: 1. Lipoma removal. 2. Dilatation and curettage 1988. 3. Two cerclage placements in previous pregnancy.  Abdominally placed cerclage    for this pregnancy.  PAST OBSTETRICAL HISTORY:  Two losses at 15 weeks and one at 23 weeks.  MEDICATIONS:  Prenatal vitamins.  ALLERGIES:  Latex sensitivity, AMPICILLIN (but can take cephalosporin).  PHYSICAL EXAMINATION:  VITAL SIGNS:  Blood pressure 131/57.  HEART:  Regular rate and rhythm.  LUNGS:  Clear to auscultation bilaterally.  ABDOMEN:  Gravid, nontender.  ______ is ______ and high with stitch in place.  IMPRESSION:  Uterine pregnancy at 37-6/7 weeks, with abdominal cerclage and labor.  PLAN:  Primary low, second transverse cesarean section.  Risks and benefits were discussed at length.  Informed consent was obtained. DD:  11/06/99 TD:  11/06/99 Job: [id number]

## 2011-03-01 NOTE — Discharge Summary (Signed)
NAME:  Miranda Dudley, Miranda Dudley                 ACCOUNT NO.:  [id number]   MEDICAL RECORD NO.:  [id number]          PATIENT TYPE:  INP   LOCATION:  9111                          FACILITY:  [facility]   PHYSICIAN:  Duke Salvia. Marcelle Overlie, M.D.DATE OF BIRTH:  [birth year]   DATE OF ADMISSION:  07/25/2004  DATE OF DISCHARGE:  07/28/2004                                 DISCHARGE SUMMARY   ADMITTING DIAGNOSES:  1.  Intrauterine pregnancy at 60 weeks estimated gestational age.  2.  Previous cesarean section, desires repeat.  3.  Abdominal cerclage.  4.  Multiparity, desires sterility.   DISCHARGE DIAGNOSES:  1.  Status post repeat low transverse cesarean section.  2.  Viable female infant.  3.  Permanent sterilization.   PROCEDURES:  1.  Repeat low transverse cesarean section.  2.  [place] bilateral tubal ligation.  3.  Removal of abdominal cerclage.   REASON FOR ADMISSION:  Please see dictated H&P.   HOSPITAL COURSE:  The patient was a 50 year old gravida 5 para 1 that  presented to [facility] for a scheduled cesarean delivery.  The patient's previous pregnancy had been complicated by the loss of a fetus  at [redacted] weeks gestational age due to incompetence cervix.  The patient had had  a previous abdominal cerclage that had been placed.  Her last pregnancy had  been a term pregnancy where she underwent a cesarean delivery.  The patient  also had desired sterility and on the morning of admission the patient was  taken to the operating room where spinal anesthesia was administered without  difficulty. A  low transverse incision was made with the delivery of a  viable female infant weighing 6 pounds 9 ounces with Apgars of 9 at one minute  and 9 at five minutes.  Arterial cord pH was 7.31.  Abdominal cerclage was  removed at the time of her surgery and bilateral tubal ligation was also  performed.  The patient tolerated the procedures well and was taken to the  recovery room in stable condition.   On postoperative day #1, the patient was  without complaint.  Vital signs were stable, she was afebrile.  Abdomen was  soft with good return of bowel function.  Abdominal dressing was noted to be  clean, dry, and intact.  Fundus was firm and nontender.  The patient was  ambulating well.  Labs revealed hemoglobin of 9.5.  On postoperative day #2,  the patient was without complaint.  Vital signs remained stable.  Abdomen  was soft, fundus was firm and nontender.  Incision was clean, dry, and  intact.  On postoperative day #3, the patient remained stable, she was  afebrile.  Incision was clean, dry, and intact.  Staples were removed and  the patient was discharged home.   CONDITION ON DISCHARGE:  Good.   DIET:  Regular as tolerated.   ACTIVITY:  No heavy lifting, no driving x2 weeks, no vaginal entry.   FOLLOW-UP:  The patient is to follow up in the office in 1-2 weeks for an  incision check.  She is to call  for temperature greater than 100 degrees,  persistent nausea and vomiting, heavy vaginal bleeding, and/or redness or  drainage from the incisional site.   DISCHARGE MEDICATIONS:  1.  Tylox #30 one p.o. q.4-6h. p.r.n.  2.  Motrin 600 mg q.6h.  3.  Prenatal vitamins one p.o. daily.  4.  Colace one p.o. daily p.r.n.     Catalina Gravel   CC/MEDQ  D:  08/13/2004  T:  08/13/2004  Job:  [id number]

## 2011-03-01 NOTE — Op Note (Signed)
NAME:  Miranda Dudley, Miranda Dudley                 ACCOUNT NO.:  [id number]   MEDICAL RECORD NO.:  [id number]          PATIENT TYPE:  AMB   LOCATION:  SDC                           FACILITY:  [facility]   PHYSICIAN:  Dineen Kid. Rana Snare, M.D.    DATE OF BIRTH:  13-Feb-1961   DATE OF PROCEDURE:  02/15/2005  DATE OF DISCHARGE:                                 OPERATIVE REPORT   PREOPERATIVE DIAGNOSES:  1.  Abnormal uterine bleeding.  2.  Endometrial mass consistent with a polyp.  3.  Intramural fibroids.   POSTOPERATIVE DIAGNOSES:  1.  Abnormal uterine bleeding.  2.  Endometrial mass consistent with a polyp.  3.  Intramural fibroids.   PROCEDURES:  Hysteroscopy, dilatation and curettage.   SURGEON:  Dineen Kid. Rana Snare, M.D.   ANESTHESIA:  General endotracheal.   INDICATIONS:  [name] is a 50 year old G5, P2, A3, with problems with  cramping, menorrhagia, changing the pad every two hours when she is on her  cycle.  It has been getting worse over the last couple of months.  She  underwent a sonohysterogram on April 11 showing several small fibroids but  also measuring a 9 mm polypoid mass within the endometrial cavity, which is  suspicious for polyp.  She presents for definitive surgical evaluation and  treatment of this.   FINDINGS AT THE TIME OF SURGERY:  Normal-appearing ostia.  There is a  thickened endometrium on the anterior portion of the uterus.  No obvious  polyp was seen.  The endometrial cavity is somewhat distorted due to  fibroids.  The cervix is otherwise normal.   DESCRIPTION OF PROCEDURE:  After adequate analgesia, the patient was placed  in the dorsal lithotomy position, where she is sterilely prepped and draped,  the bladder is sterilely drained.  A Graves speculum is placed.  A tenaculum  is placed on the anterior lip of the cervix, and the uterus is sounded to 9  cm, easily dilated to a #27 Pratt dilator.  The hysteroscope was inserted  and the above findings were noted.  Curettage was  performed, retrieving  small fragments of endometrial curettings.  This was performed until a  gritty surface was felt throughout the endometrial cavity.  A second look  with the hysteroscope at this time revealed a normal-appearing endometrial  cavity with the exception of some slight undulations along the anterior  endometrial wall consistent with intramural fibroids.  No obvious polyps or  other pathology was noted at this time.  The hysteroscope was then removed,  the tenaculum removed from the anterior lip of the cervix.  Direct pressure  was applied to achieve hemostasis.  The speculum was then removed and the  patient was transferred to the recovery room in stable condition.  Sponge,  needle and instrument count was normal x3.  Estimated blood loss was  minimal.  Sorbitol deficit was 30 mL.  The patient received 30 mg of Toradol  postoperatively.   DISPOSITION:  The patient will be discharged home and will follow up in the  office in two to three weeks.  Sent home with a routine instruction sheet  for D&C.  Told to return for increased pain, fever or bleeding.      DCL/MEDQ  D:  02/15/2005  T:  02/15/2005  Job:  [id number]

## 2011-03-01 NOTE — H&P (Signed)
NAME:  Miranda Dudley, Miranda Dudley                 ACCOUNT NO.:  [id number]   MEDICAL RECORD NO.:  [id number]          PATIENT TYPE:  AMB   LOCATION:  SDC                           [facility]ACILITY:  [facility]   PHYSICIAN:  Dineen Kid. Rana Snare, M.D.    DATE OF BIRTH:  [birth year]   DATE OF ADMISSION:  07/25/2004  DATE OF DISCHARGE:                                HISTORY & PHYSICAL   HISTORY OF PRESENT ILLNESS:  Miranda Dudley is a 50 year old, G5, P1, A2, with  one living child, at 71 weeks estimated gestational age with an [facility] of  August 01, 2004, who presents for repeat cesarean section.  Her past  history has been complicated by incompetent cervix and placement of  abdominal cerclage.  Her pregnancy has been complicated by preterm labor and  advanced maternal age.  She declined amniocentesis.  Underwent a level II  ultrasound and first trimester screen, which was essentially normal.   PAST HISTORY:  Significant for incompetent cervix, delivering at 23 weeks,  and the baby dying subsequently thereafter.  Her last delivery was at term,  and the child is healthy.  She desires repeat cesarean section, and presents  for that today, as well as tubal ligation and cerclage removal.   PAST MEDICAL HISTORY:  1.  Significant for borderline chronic hypertension.  2.  Hypothyroidism, after having a partial thyroidectomy.  3.  History of fibroids.  4.  History of migraines.   PAST SURGICAL HISTORY:  1.  She has had cerclage placement.  2.  She has had a D&E.  3.  She has had a cesarean section.   MEDICATIONS:  1.  Terbutaline as needed.  2.  Levoxyl 75 mg daily.   ALLERGIES:  1.  She has a LATEX sensitivity.  2.  AMPICILLIN.   PHYSICAL EXAMINATION:  VITAL SIGNS:  Blood pressure is 128/82.  Weight is  310 pounds.  HEART:  Regular rate and rhythm.  LUNGS:  Clear to auscultation bilaterally.  ABDOMEN:  Gravid, nontender.  PELVIC:  Cervix is closed, thick, and high.   IMPRESSION AND PLAN:  Intrauterine pregnancy at 71  weeks estimated  gestational age.  Previous cesarean section and abdominal cerclage.  Plan  repeat low transverse cesarean section.  The patient also desires tubal  ligation; we plan to perform that, as well as removal of the abdominal  cerclage.  Risks and benefits of the procedure were  discussed at length, which include, but are not limited to, risk of  infection, bleeding, damage to uterus, tubes, ovaries, bowel, bladder,  fetus, risk of tubal failure quoted at 5 out of 1000.  She does give her  informed consent and wishes to proceed.      DCL/MEDQ  D:  07/24/2004  T:  07/24/2004  Job:  [id number]

## 2011-03-01 NOTE — Op Note (Signed)
[facility]  Patient:    Miranda Dudley                    MRN: [id number] Proc. Date: 11/06/99 Adm. Date:  [id number] Attending:  Trevor Iha                           Operative Report  PREOPERATIVE DIAGNOSES:       Intrauterine pregnancy at 37-6/7 weeks, abdominal  cerclage and labor.  POSTOPERATIVE DIAGNOSES:      Intrauterine pregnancy at 37-6/7 weeks, abdominal  cerclage and labor.  OPERATION:                    Primary low transverse cesarean section.  SURGEON:                      Trevor Iha, M.D.  ANESTHESIA:                   Spinal.  ESTIMATED BLOOD LOSS:         1000 cc.  INDICATIONS:                  Ms. Kalama is a 50 year old, G4, P1, AB 2, with no  living children with a history of recurrent miscarriages despite cerclage, having abdominal placed cerclage this pregnancy (otherwise complicated by preterm labor). She presents today in active labor, contracting every three to five minutes. Plan is to proceed with a primary low transverse cesarean section.  Risks and benefits were discussed at length.  Informed consent was obtained.  FINDINGS:                     Viable female infant, Apgars 8 and 9, pH was 7.27 arterial.  DESCRIPTION OF PROCEDURE:     After adequate analgesia, the patient was placed n the supine position in the left lateral tilt.  She was sterilely prepared and draped.  A Foley catheter was sterilely placed.  The previous scar was removed.  Incision was two fingerbreaths at the pubis symphysis, and advanced ______ , taken down sharply to the fascia which was ______ transverse then extended superiorly  and inferiorly down the bellies rectus muscle.  The peritoneum was entered sharply. Bladder blade was placed.  The uterine ______ ______.  nicked, incised transversely and bladder flap was created.  A low segment myotomy incision was ade down to the infants vertex and extended laterally  with the operators fingertips. The infants vertex was then delivered with a vacuum extractor.  Bulb suction of the nares and pharynx, the infant was then delivered, handed to the pediatricians for resuscitation after the cord was clamped.  The cord blood was then obtained. Placenta was extracted manually.  The uterus exteriorized, wiped clean with a dry lap.  The myotomy incision was closed in two layers, the first being a running locking layer of 0 Monocryl, the second being an imbricating layer.  Good hemostasis.  Normal-appearing uterus, tubes, and ovaries.  The uterus was placed back in the peritoneal cavity.  Brief examination of the myotomy incision showed small areas that required a figure-of-eight of 0 Monocryl for hemostasis. After copious irrigation adequate hemostasis was assured the peritoneum and rectus was closed with 0 Monocryl.  Irrigation was once again applied and after adequate hemostasis and the fascia was closed with two sutures of 0 PDS in a running  fashion.  Irrigation was once again applied and after adequate hemostasis the skin was stapled and Steri-Strips applied.  The patient tolerated the procedure well, was stable and transferred to the recovery room.  Estimated blood loss was 1000 cc.  The patient received 1 g of Cefotan after delivery of the placenta. DD:  11/05/99 TD:  11/07/99 Job: [id number]

## 2011-03-01 NOTE — Discharge Summary (Signed)
[facility]  Patient:    Miranda Dudley                    MRN: [id number] Adm. Date:  [id number] Disc. Date: [id number] Attending:  Trevor Iha Dictator:   Danie Chandler, R.N.                           Discharge Summary  ADMISSION DIAGNOSES:          1. Intrauterine pregnancy at 40 and 6/[redacted] weeks                                  gestation.                               2. Abdominal cerclage in labor.  DISCHARGE DIAGNOSES:          1. Intrauterine pregnancy at 51 and 6/[redacted] weeks                                  gestation.                               2. Abdominal cerclage in labor.  PROCEDURE:                    On November 06, 1999, primary low transverse cesarean section.  REASON FOR ADMISSION:         The patient is a 50 year old, gravida 4, para 1, ith no living children with a history of recurrent miscarriages despite cerclage having abdominal-placed cerclage this pregnancy.  Pregnancy otherwise complicated by preterm labor.  The patient presented in active labor contracting every three to five minutes, and the plan was to proceed with a primary low transverse cesarean section.  HOSPITAL COURSE:              The patient is taken to the operating room and undergoes the above-named procedure without complications.  This is productive f a viable female infant with Apgars of 8 at one minute and 9 at five minutes and an arterial cord pH of 7.27.  Postoperatively the patient does well.  On postoperative day #1 she had a good return of bowel function, her hemoglobin was 8.6, hematocrit 26.5, and white blood cell count 6.3.  She was started on iron daily and had a repeat CBC ordered for November 08, 1999.  On postoperative day #2 the patient was tolerating a regular diet, ambulating well without difficulty, and had good pain control.  Her hemoglobin was stable at 8.1, and she was continued on iron daily. She was discharge home on  postoperative day #3.  CONDITION ON DISCHARGE:       Good.  DIET:                         Regular as tolerated.  DISCHARGE INSTRUCTIONS:       Activity:  No heavy lifting, no driving, no vaginal entry.  FOLLOWUP:                     She is to follow up in the office in  one to two weeks for incision check, and she is to call for temperature greater than 100 degrees, persistent nausea or vomiting, heavy vaginal bleeding, and/or redness or drainage from the incision site.  DISCHARGE MEDICATIONS:        1. Prenatal vitamin one p.o. q.d.                               2. Niferex 150 mg one p.o. q.d.                               3. Tylox #30 as directed by M.D.                               4. Z-Pak as needed for bronchitis. DD:  11/09/99 TD:  11/10/99 Job: [id number]

## 2011-03-01 NOTE — Op Note (Signed)
NAME:  Miranda Dudley, Miranda Dudley                 ACCOUNT NO.:  [id number]   MEDICAL RECORD NO.:  [id number]          PATIENT TYPE:  INP   LOCATION:  9111                          FACILITY:  [facility]   PHYSICIAN:  Dineen Kid. Rana Snare, M.D.    DATE OF BIRTH:  08/22/1961   DATE OF PROCEDURE:  07/25/2004  DATE OF DISCHARGE:                                 OPERATIVE REPORT   PREOPERATIVE DIAGNOSES:  1.  Intrauterine pregnancy at 39 weeks.  2.  Previous cesarean section.  3.  Abdominal cerclage.  4.  Desire sterility.   POSTOPERATIVE DIAGNOSES:  1.  Intrauterine pregnancy at 39 weeks.  2.  Previous cesarean section.  3.  Abdominal cerclage.  4.  Desire sterility.   PROCEDURE:  Repeat low-segment transverse cesarean section, [facility]  bilateral tubal ligation, removal of abdominal cerclage.   SURGEON:  Dineen Kid. Rana Snare, M.D.   ASSISTANT:  Guy Sandifer. Henderson Cloud, M.D.   ANESTHESIA:  Spinal.   INDICATIONS:  Miranda Dudley is 50 year old whose previous pregnancy was  complicated by loss of fetus at 23 weeks due to incompetent cervix.  Abdominal cerclage was placed.  She carried to full term last pregnancy and  underwent a cesarean section.  This pregnancy has been complicated by  advanced maternal age and pre-term labor and hypothyroidism.  She also  desires sterility.  Plan is to proceed with repeat cesarean section and  tubal ligation as well as removal of the cerclage.  She understands the  failure rate of tubal ligation is 02/999, does give her informed consents,  understands the risks associated with surgery was included but not limited  to risk of infection, bleeding, damage to uterus, tubes, ovaries, bowel,  bladder, fetus, and does give her informed consent.   FINDINGS AT THE TIME OF SURGERY:  A viable female infant, Apgars are 8 and 9.  Weight 6 pounds 9 ounces.  pH arterial 7.31.   DESCRIPTION OF PROCEDURE:  After adequate analgesia, the patient placed in  the supine position, left lateral tilt.  She was  sterilely prepped and  draped.  The bladder was sterilely drained with a Foley catheter.  A  Pfannenstiel skin incision was made two fingerbreadths above the pubic  symphysis, taken down sharply to the fascia which was incised transversely  and extended superiorly then inferiorly off the bellies of the rectus  muscle, separated sharply in the midline. The bladder flap was created and  placed behind the bladder blade.  A low segment  myotomy incision was made  down to the infant's vertex.  A vacuum extractor was placed and the infant's  vertex was delivered atraumatically.  The nares and oropharynx suctioned.  The infant was then delivered, cord clamped, cut, and handed to  pediatricians for resuscitation.  Cord blood was obtained.  Placenta  extracted manually.  The uterus was exteriorized, wiped clean with a dry  lap.  The myotomy incision was closed in two layers, the first being a  running locking layer, the second being an imbricating layer.  Tubal  ligation was carried out in a [facility]'s style where  the midportion of the  tube was clamped with Babcock clamp.  Two sutures of 0 plain were placed  through the mesosalpinx, doubly ligated, central portion excise,  2-0 silk  was used to ligate the proximal portion of the tube.  This was done  bilaterally.  The left fallopian tube had bled along the fimbriated end,  unable to control the bleeding.  A distal salpingectomy was then performed  using sutures of 0 Monocryl along the mesosalpinx.  The uterus was placed  back in the peritoneal cavity and after copious amount of irrigation and  adequate hemostasis was assured, the bladder was dissected off the anterior  surface of the cervix and the abdominal cerclage was easily removed.  Hemostasis was achieved with a suture 0 Monocryl in a figure-of-eight.  After copious amount of irrigation, adequate hemostasis was assured, the  peritoneum was closed with 2-0 Vicryl in a running fashion.  The  rectus  muscle plicated in the midline.  The fascia was then closed with a single  suture of 0 PDS in a running fashion.  Irrigation was applied and after  adequate hemostasis, the skin was stapled, Steri-Strips were applied.  The  patient tolerated the procedure well, was stable and transferred to recovery  room.  Sponge and instrument count was normal x3.  The patient received 1 g  of Rocephin after delivery of the placenta and will stay on this for the  next 24 hours.  Estimated blood loss was 800 mL.      DCL/MEDQ  D:  07/25/2004  T:  07/26/2004  Job:  [id number]

## 2011-03-04 ENCOUNTER — Telehealth: Payer: Self-pay | Admitting: *Deleted

## 2011-03-04 MED ORDER — HYDROCHLOROTHIAZIDE 25 MG PO TABS: 12.5000 mg | ORAL_TABLET | Freq: Every day | ORAL | Status: DC

## 2011-03-04 NOTE — Telephone Encounter (Signed)
RX Done

## 2011-05-15 ENCOUNTER — Other Ambulatory Visit: Payer: Self-pay | Admitting: Endocrinology

## 2011-08-12 ENCOUNTER — Ambulatory Visit (INDEPENDENT_AMBULATORY_CARE_PROVIDER_SITE_OTHER): Payer: PRIVATE HEALTH INSURANCE | Admitting: Endocrinology

## 2011-08-12 ENCOUNTER — Encounter: Payer: Self-pay | Admitting: Endocrinology

## 2011-08-12 DIAGNOSIS — E890 Postprocedural hypothyroidism: Secondary | ICD-10-CM

## 2011-08-12 DIAGNOSIS — C73 Malignant neoplasm of thyroid gland: Secondary | ICD-10-CM

## 2011-08-12 DIAGNOSIS — I10 Essential (primary) hypertension: Secondary | ICD-10-CM | POA: Insufficient documentation

## 2011-08-12 DIAGNOSIS — D500 Iron deficiency anemia secondary to blood loss (chronic): Secondary | ICD-10-CM

## 2011-08-12 DIAGNOSIS — E7800 Pure hypercholesterolemia, unspecified: Secondary | ICD-10-CM

## 2011-08-12 DIAGNOSIS — E042 Nontoxic multinodular goiter: Secondary | ICD-10-CM

## 2011-08-12 DIAGNOSIS — D509 Iron deficiency anemia, unspecified: Secondary | ICD-10-CM

## 2011-08-12 DIAGNOSIS — R51 Headache: Secondary | ICD-10-CM

## 2011-08-12 DIAGNOSIS — Z23 Encounter for immunization: Secondary | ICD-10-CM

## 2011-08-12 DIAGNOSIS — E119 Type 2 diabetes mellitus without complications: Secondary | ICD-10-CM

## 2011-08-12 DIAGNOSIS — Z79899 Other long term (current) drug therapy: Secondary | ICD-10-CM

## 2011-08-12 NOTE — Patient Instructions (Signed)
blood tests are being requested for you today.  please call [phone] to hear your test results.  You will be prompted to enter the 9-digit "MRN" number that appears at the top left of this page, followed by #.  Then you will hear the message. pending the test results, please continue the same medications for now Please schedule a regular physical soon.

## 2011-08-12 NOTE — Progress Notes (Signed)
  Subjective:    Patient ID: Miranda Dudley, female    DOB: 09/04/1961, 50 y.o.   MRN: [id number]  HPI Pt has 1 week of intermittent moderate headache, throughout the head.  No assoc n/v.  She found bp to be slightly elevated then.   She doesn't take zocor.   Past Medical History  Diagnosis Date  . UNSPECIFIED IRON DEFICIENCY ANEMIA 08/20/2007  . NONSPECIFIC ABNORMAL ELECTROCARDIOGRAM 08/20/2007  . IRON DEFICIENCY ANEMIA SECONDARY TO BLOOD LOSS 11/29/2008  . HYPERLIPIDEMIA 08/19/2007  . HYPERCHOLESTEROLEMIA 05/04/2010  . Headache 08/20/2007  . GOITER, MULTINODULAR 08/19/2007  . DIABETES MELLITUS, TYPE II 08/19/2007  . 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 ANXIETY 05/04/2010  . Migraine headache     Past Surgical History  Procedure Date  . Tubal ligation 2005  . Thyroidectomy 09/2003    left     History   Social History  . Marital Status: Married    Spouse Name: N/A    Number of Children: N/A  . Years of Education: N/A   Occupational History  . Banking    Social History Main Topics  . Smoking status: Never Dudley   . Smokeless tobacco: Not on file  . Alcohol Use: Not on file  . Drug Use: Not on file  . Sexually Active: Not on file   Other Topics Concern  . Not on file   Social History Narrative  . No narrative on file    Current Outpatient Prescriptions on File Prior to Visit  Medication Sig Dispense Refill  . hydrochlorothiazide 25 MG tablet Take 0.5 tablets (12.5 mg total) by mouth daily.  90 tablet  1  . ketoprofen (ORUDIS) 75 MG capsule Take 75 mg by mouth daily.        Marland Kitchen LEVOXYL 75 MCG tablet TAKE 1 TABLET DAILY  90 tablet  0   No Known Allergies  Family History  Problem Relation Age of Onset  . Cancer Neg Hx    BP 136/82  Pulse 81  Temp(Src) 97.6 F (36.4 C) (Oral)  Ht 5\' 4"  (1.626 m)  Wt 258 lb 1.3 oz (117.064 kg)  BMI 44.30 kg/m2  SpO2 99%  Review of Systems Denies loc and  visual loss.      Objective:   Physical Exam VITAL SIGNS:  See vs page. GENERAL: no distress.  Obese. Pulses: dorsalis pedis intact bilat.   Feet: no deformity.  no ulcer on the feet.  feet are of normal color and temp.  no edema.      Assessment & Plan:  Dyslipidemia, therapy limited by noncompliance.  i'll do the best i can. Htn, with ? Of situational component Headache.  This may exac htn.

## 2011-08-23 ENCOUNTER — Other Ambulatory Visit (INDEPENDENT_AMBULATORY_CARE_PROVIDER_SITE_OTHER): Payer: PRIVATE HEALTH INSURANCE

## 2011-08-23 DIAGNOSIS — E890 Postprocedural hypothyroidism: Secondary | ICD-10-CM

## 2011-08-23 DIAGNOSIS — I10 Essential (primary) hypertension: Secondary | ICD-10-CM

## 2011-08-23 DIAGNOSIS — E119 Type 2 diabetes mellitus without complications: Secondary | ICD-10-CM

## 2011-08-23 DIAGNOSIS — D500 Iron deficiency anemia secondary to blood loss (chronic): Secondary | ICD-10-CM

## 2011-08-23 DIAGNOSIS — Z79899 Other long term (current) drug therapy: Secondary | ICD-10-CM

## 2011-08-23 DIAGNOSIS — E7800 Pure hypercholesterolemia, unspecified: Secondary | ICD-10-CM

## 2011-08-23 LAB — BASIC METABOLIC PANEL
Calcium: 9 mg/dL (ref 8.4–10.5)
Chloride: 104 mEq/L (ref 96–112)
Creatinine, Ser: 1 mg/dL (ref 0.4–1.2)
Glucose, Bld: 77 mg/dL (ref 70–99)
Potassium: 4 mEq/L (ref 3.5–5.1)

## 2011-08-23 LAB — LIPID PANEL: Total CHOL/HDL Ratio: 3

## 2011-08-23 LAB — CBC WITH DIFFERENTIAL/PLATELET
Basophils Absolute: 0 10*3/uL (ref 0.0–0.1)
Eosinophils Absolute: 0.2 10*3/uL (ref 0.0–0.7)
Hemoglobin: 12.1 g/dL (ref 12.0–15.0)
Lymphocytes Relative: 35.3 % (ref 12.0–46.0)
MCHC: 33.3 g/dL (ref 30.0–36.0)
Monocytes Absolute: 0.4 10*3/uL (ref 0.1–1.0)
Neutro Abs: 2.6 10*3/uL (ref 1.4–7.7)
RBC: 4.16 Mil/uL (ref 3.87–5.11)
RDW: 13.9 % (ref 11.5–14.6)

## 2011-08-23 LAB — URINALYSIS, ROUTINE W REFLEX MICROSCOPIC
Bilirubin Urine: NEGATIVE
Ketones, ur: NEGATIVE
Total Protein, Urine: NEGATIVE
Urine Glucose: NEGATIVE
Urobilinogen, UA: 0.2 (ref 0.0–1.0)

## 2011-08-23 LAB — HEPATIC FUNCTION PANEL
ALT: 13 U/L (ref 0–35)
AST: 18 U/L (ref 0–37)
Albumin: 3.7 g/dL (ref 3.5–5.2)
Alkaline Phosphatase: 40 U/L (ref 39–117)
Total Protein: 7.5 g/dL (ref 6.0–8.3)

## 2011-08-23 LAB — TSH: TSH: 1.05 u[IU]/mL (ref 0.35–5.50)

## 2011-08-23 LAB — IBC PANEL: Iron: 40 ug/dL — ABNORMAL LOW (ref 42–145)

## 2011-08-28 ENCOUNTER — Encounter: Payer: PRIVATE HEALTH INSURANCE | Admitting: Endocrinology

## 2011-09-02 ENCOUNTER — Ambulatory Visit: Payer: PRIVATE HEALTH INSURANCE | Admitting: Endocrinology

## 2011-10-05 ENCOUNTER — Other Ambulatory Visit: Payer: Self-pay | Admitting: Endocrinology

## 2011-10-07 ENCOUNTER — Other Ambulatory Visit: Payer: Self-pay | Admitting: Endocrinology

## 2012-06-05 ENCOUNTER — Other Ambulatory Visit (INDEPENDENT_AMBULATORY_CARE_PROVIDER_SITE_OTHER): Payer: 59

## 2012-06-05 ENCOUNTER — Ambulatory Visit (INDEPENDENT_AMBULATORY_CARE_PROVIDER_SITE_OTHER): Payer: Managed Care, Other (non HMO) | Admitting: Endocrinology

## 2012-06-05 ENCOUNTER — Encounter: Payer: Self-pay | Admitting: Endocrinology

## 2012-06-05 VITALS — BP 122/82 | HR 76 | Temp 98.3°F | Ht 64.0 in | Wt 259.0 lb

## 2012-06-05 DIAGNOSIS — E119 Type 2 diabetes mellitus without complications: Secondary | ICD-10-CM

## 2012-06-05 LAB — HEMOGLOBIN A1C: Hgb A1c MFr Bld: 6 % (ref 4.6–6.5)

## 2012-06-05 NOTE — Progress Notes (Signed)
  Subjective:    Patient ID: Miranda Dudley, female    DOB: 04-27-1961, 51 y.o.   MRN: [id number]  HPI Pt returns for f/u of type 2 dm.  She has not required any medication for this.  She reports weight gain. She was noted to have a very small focus of papillary adenocarcinoma of the thyroid in 2004, after a lobectomy.  She does not notice any nodule at the neck. Past Medical History  Diagnosis Date  . UNSPECIFIED IRON DEFICIENCY ANEMIA 08/20/2007  . NONSPECIFIC ABNORMAL ELECTROCARDIOGRAM 08/20/2007  . IRON DEFICIENCY ANEMIA SECONDARY TO BLOOD LOSS 11/29/2008  . HYPERLIPIDEMIA 08/19/2007  . HYPERCHOLESTEROLEMIA 05/04/2010  . Headache 08/20/2007  . GOITER, MULTINODULAR 08/19/2007  . DIABETES MELLITUS, TYPE II 08/19/2007  . 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 ANXIETY 05/04/2010  . Migraine headache     Past Surgical History  Procedure Date  . Tubal ligation 2005  . Thyroidectomy 09/2003    left     History   Social History  . Marital Status: Married    Spouse Name: N/A    Number of Children: N/A  . Years of Education: N/A   Occupational History  . Banking    Social History Main Topics  . Smoking status: Never Dudley   . Smokeless tobacco: Not on file  . Alcohol Use: Not on file  . Drug Use: Not on file  . Sexually Active: Not on file   Other Topics Concern  . Not on file   Social History Narrative  . No narrative on file    Current Outpatient Prescriptions on File Prior to Visit  Medication Sig Dispense Refill  . ketoprofen (ORUDIS) 75 MG capsule TAKE ONE CAPSULE BY MOUTH THREE TIMES DAILY AS NEEDED FOR HEADACHE  90 capsule  0  . LEVOXYL 75 MCG tablet TAKE 1 TABLET DAILY (PT IS DUE FOR FOLLOW UP OFFICE VISIT FOR ADDITIONAL REFILLS. LAST OFFICE VISIT 04/2010, NO ADDITIONAL REFILLS)  90 tablet  3  . hydrochlorothiazide 25 MG tablet Take 0.5 tablets (12.5 mg total) by mouth daily.  90 tablet  1    No  Known Allergies  Family History  Problem Relation Age of Onset  . Cancer Neg Hx     BP 122/82  Pulse 76  Temp 98.3 F (36.8 C) (Oral)  Ht 5\' 4"  (1.626 m)  Wt 259 lb (117.482 kg)  BMI 44.46 kg/m2  SpO2 99%  Review of Systems Denies neck pain    Objective:   Physical Exam VITAL SIGNS:  See vs page GENERAL: no distress Neck: a healed scar is present.  i do not appreciate a nodule in the thyroid or elsewhere in the neck.    Lab Results  Component Value Date   TSH 1.05 08/23/2011      Assessment & Plan:  Postsurgical hypothyroidism, Well-replaced H/o very small focus of thyroid cancer.  No evidence of recurrence

## 2012-06-05 NOTE — Patient Instructions (Addendum)
blood tests are being requested for you today.  You will receive a letter with results. You should have your blood sugar (a1c) test once or twice a year. You should have a physical examination of your neck each year.  i would be happy to see you back here whenever you want.

## 2012-06-08 ENCOUNTER — Telehealth: Payer: Self-pay | Admitting: *Deleted

## 2012-06-08 ENCOUNTER — Other Ambulatory Visit: Payer: Self-pay | Admitting: Endocrinology

## 2012-06-08 NOTE — Telephone Encounter (Signed)
Please advise. Okay to change to Synthroid or Levothyroxine because Levoxyl is unavailable?

## 2012-06-08 NOTE — Telephone Encounter (Signed)
R'cd fax from [facility] pharmacy asking to change Levoxyl to Levothyroxine or Synthroid because Levoxyl is not available. Please advise.

## 2012-06-08 NOTE — Telephone Encounter (Signed)
Generic is ok.

## 2012-06-09 MED ORDER — LEVOTHYROXINE SODIUM 75 MCG PO TABS: 75.0000 ug | ORAL_TABLET | Freq: Every day | ORAL | Status: DC

## 2012-06-09 NOTE — Telephone Encounter (Signed)
Rx for Levothyroxine sent to [facility], pt informed.

## 2012-09-14 ENCOUNTER — Other Ambulatory Visit: Payer: Self-pay | Admitting: Endocrinology

## 2012-10-23 ENCOUNTER — Other Ambulatory Visit: Payer: Self-pay | Admitting: Endocrinology

## 2012-10-23 NOTE — Telephone Encounter (Signed)
At last ov, she was told to ret here prn only, and to f/u with pcp

## 2013-04-22 ENCOUNTER — Other Ambulatory Visit: Payer: Self-pay | Admitting: Endocrinology

## 2013-06-03 ENCOUNTER — Other Ambulatory Visit: Payer: Self-pay | Admitting: Endocrinology

## 2013-07-23 ENCOUNTER — Ambulatory Visit: Payer: Managed Care, Other (non HMO) | Admitting: Endocrinology

## 2013-07-29 ENCOUNTER — Other Ambulatory Visit: Payer: Managed Care, Other (non HMO)

## 2013-08-02 ENCOUNTER — Ambulatory Visit (INDEPENDENT_AMBULATORY_CARE_PROVIDER_SITE_OTHER): Payer: Managed Care, Other (non HMO) | Admitting: Endocrinology

## 2013-08-02 VITALS — BP 118/78 | HR 87 | Temp 98.2°F | Resp 12 | Ht 64.0 in | Wt 273.8 lb

## 2013-08-02 DIAGNOSIS — E119 Type 2 diabetes mellitus without complications: Secondary | ICD-10-CM

## 2013-08-02 DIAGNOSIS — C73 Malignant neoplasm of thyroid gland: Secondary | ICD-10-CM

## 2013-08-02 DIAGNOSIS — Z23 Encounter for immunization: Secondary | ICD-10-CM

## 2013-08-02 DIAGNOSIS — E890 Postprocedural hypothyroidism: Secondary | ICD-10-CM

## 2013-08-02 LAB — TSH: TSH: 0.87 u[IU]/mL (ref 0.35–5.50)

## 2013-08-02 MED ORDER — TRIAMCINOLONE ACETONIDE 0.1 % EX CREA: TOPICAL_CREAM | Freq: Three times a day (TID) | CUTANEOUS | Status: DC

## 2013-08-02 NOTE — Patient Instructions (Addendum)
Please call the [place] office to make an appointment with a new PCP at [phone]. blood tests are being requested for you today.  We'll contact you with results. Please return in 1 year.  good diet and exercise habits significanly improve the control of your diabetes.  please let me know if you wish to be referred to a dietician.  high blood sugar is very risky to your health.  you should see an eye doctor every year.  You are at higher than average risk for pneumonia and hepatitis-B.  You should be vaccinated against both.   i have sent a prescription to your pharmacy, for the rash.

## 2013-08-02 NOTE — Progress Notes (Signed)
  Subjective:    Patient ID: Miranda Dudley, female    DOB: 12/10/1960, 51 y.o.   MRN: [id number]  HPI Pt returns for f/u of type 2 dm (dx'ed 2011; she has mild if any neuropathy of the lower extremities; no associated chronic complications; she has not required any medication).   She has few mos of slight rash at the flexor aspects of the limbs, and assoc itching Past Medical History  Diagnosis Date  . UNSPECIFIED IRON DEFICIENCY ANEMIA 08/20/2007  . NONSPECIFIC ABNORMAL ELECTROCARDIOGRAM 08/20/2007  . IRON DEFICIENCY ANEMIA SECONDARY TO BLOOD LOSS 11/29/2008  . HYPERLIPIDEMIA 08/19/2007  . HYPERCHOLESTEROLEMIA 05/04/2010  . Headache 08/20/2007  . GOITER, MULTINODULAR 08/19/2007  . DIABETES MELLITUS, TYPE II 08/19/2007  . 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 ANXIETY 05/04/2010  . Migraine headache     Past Surgical History  Procedure Laterality Date  . Tubal ligation  2005  . Thyroidectomy  09/2003    left     History   Social History  . Marital Status: Married    Spouse Name: N/A    Number of Children: N/A  . Years of Education: N/A   Occupational History  . Banking    Social History Main Topics  . Smoking status: Never Dudley   . Smokeless tobacco: Not on file  . Alcohol Use: Not on file  . Drug Use: Not on file  . Sexual Activity: Not on file   Other Topics Concern  . Not on file   Social History Narrative  . No narrative on file    Current Outpatient Prescriptions on File Prior to Visit  Medication Sig Dispense Refill  . hydrochlorothiazide (HYDRODIURIL) 25 MG tablet TAKE ONE-HALF TABLET BY MOUTH DAILY  30 tablet  0  . ketoprofen (ORUDIS) 75 MG capsule TAKE ONE CAPSULE BY MOUTH THREE TIMES DAILY AS NEEDED FOR HEADACHE  90 capsule  1  . levothyroxine (SYNTHROID, LEVOTHROID) 75 MCG tablet TAKE ONE TABLET BY MOUTH DAILY  30 tablet  1   No current facility-administered medications on file prior  to visit.    No Known Allergies  Family History  Problem Relation Age of Onset  . Cancer Neg Hx     BP 118/78  Pulse 87  Temp(Src) 98.2 F (36.8 C)  Resp 12  Ht 5\' 4"  (1.626 m)  Wt 273 lb 12.8 oz (124.195 kg)  BMI 46.97 kg/m2  SpO2 98%   Review of Systems She does not notice any nodule at the anterior neck.  She has weight gain    Objective:   Physical Exam VITAL SIGNS:  See vs page GENERAL: no distress Neck: a healed scar is present.  i do not appreciate a nodule in the thyroid or elsewhere in the neck. Skin: no rash is seen now   Lab Results  Component Value Date   HGBA1C 6.6* 08/02/2013   Lab Results  Component Value Date   TSH 0.87 08/02/2013      Assessment & Plan:  Heat rash, new DM: well-controlled Postsurgical hypothyroidism, well-replaced

## 2013-08-03 ENCOUNTER — Telehealth: Payer: Self-pay | Admitting: *Deleted

## 2013-08-03 NOTE — Telephone Encounter (Signed)
Called pt and advised her that her labs were good results Please continue the same medication. Pt requested the values, advised her of them. Pt understood.

## 2013-08-12 ENCOUNTER — Other Ambulatory Visit: Payer: Self-pay | Admitting: Endocrinology

## 2013-08-13 ENCOUNTER — Other Ambulatory Visit: Payer: Self-pay | Admitting: Endocrinology

## 2013-08-18 ENCOUNTER — Other Ambulatory Visit: Payer: Self-pay

## 2013-08-18 MED ORDER — HYDROCHLOROTHIAZIDE 25 MG PO TABS: ORAL_TABLET | ORAL | Status: DC

## 2013-08-20 ENCOUNTER — Other Ambulatory Visit: Payer: Self-pay | Admitting: *Deleted

## 2013-08-20 MED ORDER — LEVOTHYROXINE SODIUM 75 MCG PO TABS: 75.0000 ug | ORAL_TABLET | Freq: Every day | ORAL | Status: DC

## 2013-08-20 NOTE — Telephone Encounter (Signed)
Pharmacy called, rx refill for levothyroxine did not go through.

## 2014-01-09 ENCOUNTER — Other Ambulatory Visit: Payer: Self-pay | Admitting: Endocrinology

## 2014-01-21 ENCOUNTER — Other Ambulatory Visit: Payer: Self-pay | Admitting: Family

## 2014-01-21 ENCOUNTER — Ambulatory Visit
Admission: RE | Admit: 2014-01-21 | Discharge: 2014-01-21 | Disposition: A | Discharge status: Home / Self Care | Payer: Managed Care, Other (non HMO) | Source: Ambulatory Visit | Attending: Family | Admitting: Family

## 2014-01-21 DIAGNOSIS — J209 Acute bronchitis, unspecified: Secondary | ICD-10-CM

## 2014-01-21 DIAGNOSIS — R0989 Other specified symptoms and signs involving the circulatory and respiratory systems: Secondary | ICD-10-CM

## 2014-04-11 ENCOUNTER — Other Ambulatory Visit: Payer: Self-pay | Admitting: Endocrinology

## 2014-05-20 ENCOUNTER — Other Ambulatory Visit: Payer: Self-pay | Admitting: Endocrinology

## 2014-05-23 NOTE — Telephone Encounter (Signed)
Rx refilled.

## 2014-05-24 ENCOUNTER — Other Ambulatory Visit: Payer: Self-pay | Admitting: Endocrinology

## 2014-06-17 ENCOUNTER — Encounter (HOSPITAL_COMMUNITY): Payer: Self-pay | Admitting: Pharmacist

## 2014-06-22 ENCOUNTER — Encounter (HOSPITAL_COMMUNITY): Payer: Self-pay

## 2014-06-22 ENCOUNTER — Encounter (HOSPITAL_COMMUNITY)
Admission: RE | Admit: 2014-06-22 | Discharge: 2014-06-22 | Disposition: A | Discharge status: Home / Self Care | Payer: Managed Care, Other (non HMO) | Source: Ambulatory Visit | Attending: Obstetrics and Gynecology | Admitting: Obstetrics and Gynecology

## 2014-06-22 LAB — CBC
HCT: 37.1 % (ref 36.0–46.0)
HEMOGLOBIN: 12.2 g/dL (ref 12.0–15.0)
MCH: 27.9 pg (ref 26.0–34.0)
MCHC: 32.9 g/dL (ref 30.0–36.0)
MCV: 84.9 fL (ref 78.0–100.0)
PLATELETS: 266 10*3/uL (ref 150–400)
RBC: 4.37 MIL/uL (ref 3.87–5.11)
RDW: 14.4 % (ref 11.5–15.5)
WBC: 4.8 10*3/uL (ref 4.0–10.5)

## 2014-06-22 LAB — BASIC METABOLIC PANEL
ANION GAP: 11 (ref 5–15)
BUN: 15 mg/dL (ref 6–23)
CALCIUM: 8.8 mg/dL (ref 8.4–10.5)
CO2: 27 mEq/L (ref 19–32)
Chloride: 97 mEq/L (ref 96–112)
Creatinine, Ser: 0.84 mg/dL (ref 0.50–1.10)
GFR, EST NON AFRICAN AMERICAN: 79 mL/min — AB (ref >90)
Glucose, Bld: 100 mg/dL — ABNORMAL HIGH (ref 70–99)
POTASSIUM: 3.8 meq/L (ref 3.7–5.3)
SODIUM: 135 meq/L — AB (ref 137–147)

## 2014-06-22 NOTE — H&P (Addendum)
  Miranda Dudley [id number] 06/13/2014 S:  Miranda Dudley presents today for preop evaluation.  She has bilateral adnexal masses based on recent ultrasound showing a 2.1 cm right complex mass with an 18 mm area which is suspicious for a dermoid and a left ovarian 2.3 cm complex mass with small areas also suspicious for dermoid.  She has multiple fibroids, the largest measured 5 cm in size, the largest one abutting the endometrium.  She has had an endometrial ablation.  Now she is having pain and bleeding with her cycles.  At this time, she desires definitive surgical intervention including hysterectomy and removal of both tubes and ovaries.  She had a recent CA-125 which was normal.  She had a recent [place] that was perimenopausal at 45.   O:  Ultrasound shows a uterus measuring 10-12 weeks' size, largest fibroid is 5 cm in size which is submucosal versus intramural.  She has the 2 cysts as presented in the history and physical.  Recent exam:  Heart is regular rate and rhythm.  Lungs clear to auscultation bilaterally.  Abdomen is nondistended and nontender.  Pelvic exam:  The uterus is anteverted mobile, nontender approximately 8-10 weeks size.   PAST MEDICAL HISTORY:  Significant for thyroid disorder and hypertension and asthma.   MEDICATIONS:  Include Levoxyl 75 mg, hydrochlorothiazide.   ALLERGIES:  She does have an allergy to latex.   A:   Bilateral adnexal masses suspicious for bilateral dermoids, also uterine fibroids, pelvic pain and abnormal uterine bleeding.  Patient desires definitive surgical intervention.   P:  Hysterectomy with removal of both tubes and ovaries.  Discussed if there is something suspicious or appears to be malignant, appropriate staging would be necessary; however, we are going to try to approach this laparoscopically assisted.  If we can complete this laparoscopically assisted then we discussed the pros and cons and risks and benefits of recovery of that.  If not, we will convert it to an  abdominal hysterectomy with bilateral salpingo-oophorectomy and discussed also the recovery of this at length.  She has in the past had an abdominal cerclage as well as cesarean section so she is very aware of the recovery for this.  Again, we discussed the pros and cons, the risks and benefits which include but are not limited to risk of infection, bleeding and damage to the bowel, bladder, ureters, risks associated with anesthesia, blood transfusion.  She does give her informed consent and wished to proceed.  For now, we discussed menopause.  She knows that she will be menopausal after the procedure.  Discussed HRT, the pros and cons and risks and benefits.  For now she is going to see how she does.   Louretta Shorten, MD/[id number]  This patient has been seen and examined.   All of her questions were answered.  Labs and vital signs reviewed.  Informed consent has been obtained.  The History and Physical is current. 06/23/14 0715 DL

## 2014-06-22 NOTE — Patient Instructions (Signed)
   Your procedure is scheduled on: SEPT 10 AT 730AM  Enter through the Main Entrance of [facility] at: [employer] [address] AT [place] up the phone at the desk and dial [phone] and inform us of your arrival.  Please call this number if you have any problems the morning of surgery: [phone]  Remember: Do not eat food after midnight:SEPT 9 Do not drink clear liquids after:SEPT 9 Take these medicines the morning of surgery with a SIP OF WATER: TAKE BLOOD PRESSURE AND SYNTHROID DAY OF SURGERY WITH SIP OF WATER  Do not wear jewelry, make-up, or FINGER nail polish No metal in your hair or on your body. Do not wear lotions, powders, perfumes.  You may wear deodorant.  Do not bring valuables to the hospital. Contacts, dentures or bridgework may not be worn into surgery.  Leave suitcase in the car. After Surgery it may be brought to your room. For patients being admitted to the hospital, checkout time is 11:00am the day of discharge.    Patients discharged on the day of surgery will not be allowed to drive home.

## 2014-06-23 ENCOUNTER — Encounter (HOSPITAL_COMMUNITY): Payer: Managed Care, Other (non HMO) | Admitting: Certified Registered Nurse Anesthetist

## 2014-06-23 ENCOUNTER — Ambulatory Visit (HOSPITAL_COMMUNITY): Payer: Managed Care, Other (non HMO) | Admitting: Certified Registered Nurse Anesthetist

## 2014-06-23 ENCOUNTER — Inpatient Hospital Stay (HOSPITAL_COMMUNITY)
Admission: RE | Admit: 2014-06-23 | Discharge: 2014-06-25 | DRG: 742 | Disposition: A | Discharge status: Home / Self Care | Payer: Managed Care, Other (non HMO) | Source: Ambulatory Visit | Attending: Obstetrics and Gynecology | Admitting: Obstetrics and Gynecology

## 2014-06-23 ENCOUNTER — Encounter (HOSPITAL_COMMUNITY)
Admission: RE | Disposition: A | Discharge status: Home / Self Care | Payer: Self-pay | Source: Ambulatory Visit | Attending: Obstetrics and Gynecology

## 2014-06-23 ENCOUNTER — Encounter (HOSPITAL_COMMUNITY): Payer: Self-pay | Admitting: Certified Registered Nurse Anesthetist

## 2014-06-23 DIAGNOSIS — Z6841 Body Mass Index (BMI) 40.0 and over, adult: Secondary | ICD-10-CM

## 2014-06-23 DIAGNOSIS — N8000 Endometriosis of the uterus, unspecified: Secondary | ICD-10-CM | POA: Diagnosis present

## 2014-06-23 DIAGNOSIS — N838 Other noninflammatory disorders of ovary, fallopian tube and broad ligament: Secondary | ICD-10-CM | POA: Diagnosis present

## 2014-06-23 DIAGNOSIS — D649 Anemia, unspecified: Secondary | ICD-10-CM | POA: Diagnosis present

## 2014-06-23 DIAGNOSIS — J45909 Unspecified asthma, uncomplicated: Secondary | ICD-10-CM | POA: Diagnosis present

## 2014-06-23 DIAGNOSIS — I10 Essential (primary) hypertension: Secondary | ICD-10-CM | POA: Diagnosis present

## 2014-06-23 DIAGNOSIS — N925 Other specified irregular menstruation: Secondary | ICD-10-CM | POA: Diagnosis present

## 2014-06-23 DIAGNOSIS — N83209 Unspecified ovarian cyst, unspecified side: Secondary | ICD-10-CM | POA: Diagnosis present

## 2014-06-23 DIAGNOSIS — Z5331 Laparoscopic surgical procedure converted to open procedure: Secondary | ICD-10-CM

## 2014-06-23 DIAGNOSIS — N9489 Other specified conditions associated with female genital organs and menstrual cycle: Secondary | ICD-10-CM | POA: Diagnosis present

## 2014-06-23 DIAGNOSIS — N938 Other specified abnormal uterine and vaginal bleeding: Secondary | ICD-10-CM | POA: Diagnosis present

## 2014-06-23 DIAGNOSIS — Z91040 Latex allergy status: Secondary | ICD-10-CM

## 2014-06-23 DIAGNOSIS — D250 Submucous leiomyoma of uterus: Secondary | ICD-10-CM | POA: Diagnosis present

## 2014-06-23 DIAGNOSIS — Z9079 Acquired absence of other genital organ(s): Secondary | ICD-10-CM

## 2014-06-23 DIAGNOSIS — N949 Unspecified condition associated with female genital organs and menstrual cycle: Secondary | ICD-10-CM | POA: Diagnosis present

## 2014-06-23 DIAGNOSIS — D251 Intramural leiomyoma of uterus: Principal | ICD-10-CM | POA: Diagnosis present

## 2014-06-23 DIAGNOSIS — Z90722 Acquired absence of ovaries, bilateral: Secondary | ICD-10-CM

## 2014-06-23 DIAGNOSIS — Z90710 Acquired absence of both cervix and uterus: Secondary | ICD-10-CM

## 2014-06-23 HISTORY — PX: LAPAROSCOPIC ASSISTED VAGINAL HYSTERECTOMY: SHX5398

## 2014-06-23 HISTORY — PX: ABDOMINAL HYSTERECTOMY: SHX81

## 2014-06-23 SURGERY — HYSTERECTOMY, VAGINAL, LAPAROSCOPY-ASSISTED
Anesthesia: General | Site: Abdomen

## 2014-06-23 MED ORDER — SCOPOLAMINE 1 MG/3DAYS TD PT72
1.0000 | MEDICATED_PATCH | Freq: Once | TRANSDERMAL | Status: DC
  Administered 2014-06-23: 1.5 mg via TRANSDERMAL

## 2014-06-23 MED ORDER — FENTANYL CITRATE 0.05 MG/ML IJ SOLN
INTRAMUSCULAR | Status: AC
  Filled 2014-06-23: qty 5

## 2014-06-23 MED ORDER — KETOROLAC TROMETHAMINE 30 MG/ML IJ SOLN: 15.0000 mg | Freq: Once | INTRAMUSCULAR | Status: DC | PRN

## 2014-06-23 MED ORDER — SCOPOLAMINE 1 MG/3DAYS TD PT72
MEDICATED_PATCH | TRANSDERMAL | Status: AC
  Administered 2014-06-23: 1.5 mg via TRANSDERMAL
  Filled 2014-06-23: qty 1

## 2014-06-23 MED ORDER — MIDAZOLAM HCL 2 MG/2ML IJ SOLN: 0.5000 mg | Freq: Once | INTRAMUSCULAR | Status: DC | PRN

## 2014-06-23 MED ORDER — HYDROMORPHONE HCL PF 1 MG/ML IJ SOLN
INTRAMUSCULAR | Status: DC | PRN
  Administered 2014-06-23 (×2): 0.5 mg via INTRAVENOUS

## 2014-06-23 MED ORDER — DEXTROSE-NACL 5-0.45 % IV SOLN
INTRAVENOUS | Status: DC
  Administered 2014-06-23: 22:00:00 via INTRAVENOUS

## 2014-06-23 MED ORDER — PROMETHAZINE HCL 25 MG/ML IJ SOLN
INTRAMUSCULAR | Status: AC
  Filled 2014-06-23: qty 1

## 2014-06-23 MED ORDER — ROCURONIUM BROMIDE 100 MG/10ML IV SOLN
INTRAVENOUS | Status: DC | PRN
  Administered 2014-06-23 (×2): 10 mg via INTRAVENOUS
  Administered 2014-06-23: 40 mg via INTRAVENOUS

## 2014-06-23 MED ORDER — NEOSTIGMINE METHYLSULFATE 10 MG/10ML IV SOLN
INTRAVENOUS | Status: AC
  Filled 2014-06-23: qty 1

## 2014-06-23 MED ORDER — PROPOFOL 10 MG/ML IV EMUL
INTRAVENOUS | Status: AC
  Filled 2014-06-23: qty 20

## 2014-06-23 MED ORDER — ROCURONIUM BROMIDE 100 MG/10ML IV SOLN
INTRAVENOUS | Status: AC
  Filled 2014-06-23: qty 1

## 2014-06-23 MED ORDER — GLYCOPYRROLATE 0.2 MG/ML IJ SOLN
INTRAMUSCULAR | Status: AC
  Filled 2014-06-23: qty 3

## 2014-06-23 MED ORDER — ONDANSETRON HCL 4 MG/2ML IJ SOLN: 4.0000 mg | Freq: Four times a day (QID) | INTRAMUSCULAR | Status: DC | PRN

## 2014-06-23 MED ORDER — HYDROMORPHONE HCL PF 1 MG/ML IJ SOLN
INTRAMUSCULAR | Status: AC
  Filled 2014-06-23: qty 1

## 2014-06-23 MED ORDER — ONDANSETRON HCL 4 MG/2ML IJ SOLN
INTRAMUSCULAR | Status: AC
  Filled 2014-06-23: qty 2

## 2014-06-23 MED ORDER — DEXAMETHASONE SODIUM PHOSPHATE 10 MG/ML IJ SOLN
INTRAMUSCULAR | Status: DC | PRN
  Administered 2014-06-23: 4 mg via INTRAVENOUS

## 2014-06-23 MED ORDER — LIDOCAINE HCL (CARDIAC) 20 MG/ML IV SOLN
INTRAVENOUS | Status: DC | PRN
  Administered 2014-06-23: 50 mg via INTRAVENOUS

## 2014-06-23 MED ORDER — LACTATED RINGERS IR SOLN
Status: DC | PRN
  Administered 2014-06-23: 3000 mL

## 2014-06-23 MED ORDER — MIDAZOLAM HCL 2 MG/2ML IJ SOLN
INTRAMUSCULAR | Status: DC | PRN
  Administered 2014-06-23: 2 mg via INTRAVENOUS

## 2014-06-23 MED ORDER — NALOXONE HCL 0.4 MG/ML IJ SOLN: 0.4000 mg | INTRAMUSCULAR | Status: DC | PRN

## 2014-06-23 MED ORDER — HYDROMORPHONE 0.3 MG/ML IV SOLN
INTRAVENOUS | Status: DC
  Administered 2014-06-23 (×2): 4.5 mg via INTRAVENOUS
  Administered 2014-06-23: 3.5 mg via INTRAVENOUS
  Administered 2014-06-24: 1.2 mg via INTRAVENOUS
  Administered 2014-06-24: 2.5 mg via INTRAVENOUS
  Filled 2014-06-23: qty 25

## 2014-06-23 MED ORDER — LEVOTHYROXINE SODIUM 75 MCG PO TABS
75.0000 ug | ORAL_TABLET | Freq: Every day | ORAL | Status: DC
  Administered 2014-06-24 – 2014-06-25 (×2): 75 ug via ORAL
  Filled 2014-06-23 (×2): qty 1

## 2014-06-23 MED ORDER — HYDROMORPHONE HCL PF 1 MG/ML IJ SOLN
INTRAMUSCULAR | Status: AC
  Administered 2014-06-23: 0.5 mg via INTRAVENOUS
  Filled 2014-06-23: qty 1

## 2014-06-23 MED ORDER — LACTATED RINGERS IV SOLN
INTRAVENOUS | Status: DC
  Administered 2014-06-23 (×3): via INTRAVENOUS

## 2014-06-23 MED ORDER — HYDROCHLOROTHIAZIDE 12.5 MG PO CAPS
12.5000 mg | ORAL_CAPSULE | Freq: Every day | ORAL | Status: DC
  Administered 2014-06-24: 12.5 mg via ORAL
  Filled 2014-06-23 (×3): qty 1

## 2014-06-23 MED ORDER — HYDROCHLOROTHIAZIDE 25 MG PO TABS
12.5000 mg | ORAL_TABLET | Freq: Every day | ORAL | Status: DC
  Filled 2014-06-23: qty 0.5

## 2014-06-23 MED ORDER — IBUPROFEN 600 MG PO TABS
600.0000 mg | ORAL_TABLET | Freq: Four times a day (QID) | ORAL | Status: DC | PRN
  Administered 2014-06-24 – 2014-06-25 (×5): 600 mg via ORAL
  Filled 2014-06-23 (×5): qty 1

## 2014-06-23 MED ORDER — FENTANYL CITRATE 0.05 MG/ML IJ SOLN
INTRAMUSCULAR | Status: DC | PRN
  Administered 2014-06-23: 50 ug via INTRAVENOUS
  Administered 2014-06-23 (×2): 100 ug via INTRAVENOUS

## 2014-06-23 MED ORDER — DEXTROSE 5 % IV SOLN
2.0000 g | INTRAVENOUS | Status: AC
  Administered 2014-06-23: 2 g via INTRAVENOUS
  Filled 2014-06-23: qty 2

## 2014-06-23 MED ORDER — DIPHENHYDRAMINE HCL 12.5 MG/5ML PO ELIX: 12.5000 mg | ORAL_SOLUTION | Freq: Four times a day (QID) | ORAL | Status: DC | PRN

## 2014-06-23 MED ORDER — INFLUENZA VAC SPLIT QUAD 0.5 ML IM SUSY: 0.5000 mL | PREFILLED_SYRINGE | INTRAMUSCULAR | Status: DC

## 2014-06-23 MED ORDER — HYDROMORPHONE HCL PF 1 MG/ML IJ SOLN
0.2500 mg | INTRAMUSCULAR | Status: DC | PRN
Start: 2014-06-23 — End: 2014-06-23
  Administered 2014-06-23 (×3): 0.5 mg via INTRAVENOUS

## 2014-06-23 MED ORDER — MENTHOL 3 MG MT LOZG: 1.0000 | LOZENGE | OROMUCOSAL | Status: DC | PRN

## 2014-06-23 MED ORDER — ONDANSETRON HCL 4 MG/2ML IJ SOLN
INTRAMUSCULAR | Status: DC | PRN
  Administered 2014-06-23: 4 mg via INTRAVENOUS

## 2014-06-23 MED ORDER — BUPIVACAINE HCL (PF) 0.25 % IJ SOLN
INTRAMUSCULAR | Status: DC | PRN
  Administered 2014-06-23: 10 mL

## 2014-06-23 MED ORDER — ALBUTEROL SULFATE (2.5 MG/3ML) 0.083% IN NEBU: 3.0000 mL | INHALATION_SOLUTION | Freq: Four times a day (QID) | RESPIRATORY_TRACT | Status: DC | PRN

## 2014-06-23 MED ORDER — OXYCODONE-ACETAMINOPHEN 5-325 MG PO TABS
1.0000 | ORAL_TABLET | ORAL | Status: DC | PRN
  Administered 2014-06-24 – 2014-06-25 (×5): 1 via ORAL
  Filled 2014-06-23 (×4): qty 1

## 2014-06-23 MED ORDER — GLYCOPYRROLATE 0.2 MG/ML IJ SOLN
INTRAMUSCULAR | Status: DC | PRN
  Administered 2014-06-23: 0.4 mg via INTRAVENOUS

## 2014-06-23 MED ORDER — MEPERIDINE HCL 25 MG/ML IJ SOLN: 6.2500 mg | INTRAMUSCULAR | Status: DC | PRN

## 2014-06-23 MED ORDER — PROMETHAZINE HCL 25 MG/ML IJ SOLN: 6.2500 mg | INTRAMUSCULAR | Status: DC | PRN

## 2014-06-23 MED ORDER — SODIUM CHLORIDE 0.9 % IJ SOLN: 9.0000 mL | INTRAMUSCULAR | Status: DC | PRN

## 2014-06-23 MED ORDER — NEOSTIGMINE METHYLSULFATE 10 MG/10ML IV SOLN
INTRAVENOUS | Status: DC | PRN
  Administered 2014-06-23: 3 mg via INTRAVENOUS

## 2014-06-23 MED ORDER — MIDAZOLAM HCL 2 MG/2ML IJ SOLN
INTRAMUSCULAR | Status: AC
  Filled 2014-06-23: qty 2

## 2014-06-23 MED ORDER — LIDOCAINE HCL (PF) 1 % IJ SOLN
INTRAMUSCULAR | Status: AC
  Filled 2014-06-23: qty 5

## 2014-06-23 MED ORDER — BUPIVACAINE HCL (PF) 0.25 % IJ SOLN
INTRAMUSCULAR | Status: AC
  Filled 2014-06-23: qty 30

## 2014-06-23 MED ORDER — PROPOFOL 10 MG/ML IV BOLUS
INTRAVENOUS | Status: DC | PRN
  Administered 2014-06-23: 200 mg via INTRAVENOUS

## 2014-06-23 MED ORDER — DEXAMETHASONE SODIUM PHOSPHATE 4 MG/ML IJ SOLN
INTRAMUSCULAR | Status: AC
  Filled 2014-06-23: qty 1

## 2014-06-23 MED ORDER — DIPHENHYDRAMINE HCL 50 MG/ML IJ SOLN: 12.5000 mg | Freq: Four times a day (QID) | INTRAMUSCULAR | Status: DC | PRN

## 2014-06-23 MED ORDER — HYDROMORPHONE HCL PF 1 MG/ML IJ SOLN: 0.2000 mg | INTRAMUSCULAR | Status: DC | PRN

## 2014-06-23 SURGICAL SUPPLY — 60 items
ADH SKN CLS APL DERMABOND .7 (GAUZE/BANDAGES/DRESSINGS) ×1
BLADE 15 SAFETY STRL DISP (BLADE) ×5 IMPLANT
BLADE SURG 10 STRL SS (BLADE) ×3 IMPLANT
BLADE SURG 11 STRL SS (BLADE) ×6 IMPLANT
CABLE HIGH FREQUENCY MONO STRZ (ELECTRODE) IMPLANT
CATH SILICONE 16FRX5CC (CATHETERS) ×2 IMPLANT
CLOSURE WOUND 1/4 X3 (GAUZE/BANDAGES/DRESSINGS)
CLOTH BEACON ORANGE TIMEOUT ST (SAFETY) ×3 IMPLANT
CONT PATH 16OZ SNAP LID 3702 (MISCELLANEOUS) ×3 IMPLANT
COVER TABLE BACK 60X90 (DRAPES) ×5 IMPLANT
DECANTER SPIKE VIAL GLASS SM (MISCELLANEOUS) ×2 IMPLANT
DERMABOND ADVANCED (GAUZE/BANDAGES/DRESSINGS) ×2
DERMABOND ADVANCED .7 DNX12 (GAUZE/BANDAGES/DRESSINGS) ×1 IMPLANT
DRSG COVADERM PLUS 2X2 (GAUZE/BANDAGES/DRESSINGS) ×4 IMPLANT
DRSG OPSITE POSTOP 3X4 (GAUZE/BANDAGES/DRESSINGS) ×2 IMPLANT
DRSG OPSITE POSTOP 4X10 (GAUZE/BANDAGES/DRESSINGS) ×2 IMPLANT
DURAPREP 26ML APPLICATOR (WOUND CARE) ×3 IMPLANT
ELECT LIGASURE LONG (ELECTRODE) ×3 IMPLANT
ELECT REM PT RETURN 9FT ADLT (ELECTROSURGICAL)
ELECTRODE REM PT RTRN 9FT ADLT (ELECTROSURGICAL) IMPLANT
FORCEPS CUTTING 45CM 5MM (CUTTING FORCEPS) ×3 IMPLANT
GLOVE BIOGEL PI IND STRL 6.5 (GLOVE) ×1 IMPLANT
GLOVE BIOGEL PI IND STRL 7.0 (GLOVE) ×2 IMPLANT
GLOVE BIOGEL PI IND STRL 8 (GLOVE) IMPLANT
GLOVE BIOGEL PI INDICATOR 6.5 (GLOVE) ×2
GLOVE BIOGEL PI INDICATOR 7.0 (GLOVE) ×4
GLOVE BIOGEL PI INDICATOR 8 (GLOVE) ×16
GLOVE SURG SS PI 7.0 STRL IVOR (GLOVE) ×18 IMPLANT
GLOVE SURG SS PI 8.0 STRL IVOR (GLOVE) ×14 IMPLANT
GOWN STRL REUS W/ TWL LRG LVL3 (GOWN DISPOSABLE) ×7 IMPLANT
GOWN STRL REUS W/TWL LRG LVL3 (GOWN DISPOSABLE) ×21
LEGGING LITHOTOMY PAIR STRL (DRAPES) ×2 IMPLANT
NEEDLE INSUFFLATION 120MM (ENDOMECHANICALS) ×3 IMPLANT
NS IRRIG 1000ML POUR BTL (IV SOLUTION) ×7 IMPLANT
PACK LAVH (CUSTOM PROCEDURE TRAY) ×3 IMPLANT
PROTECTOR NERVE ULNAR (MISCELLANEOUS) ×3 IMPLANT
SET IRRIG TUBING LAPAROSCOPIC (IRRIGATION / IRRIGATOR) ×2 IMPLANT
SOLUTION ELECTROLUBE (MISCELLANEOUS) IMPLANT
SPONGE LAP 18X18 X RAY DECT (DISPOSABLE) ×14 IMPLANT
STAPLER VISISTAT 35W (STAPLE) ×2 IMPLANT
STRIP CLOSURE SKIN 1/4X3 (GAUZE/BANDAGES/DRESSINGS) IMPLANT
SUT MNCRL 0 MO-4 VIOLET 18 CR (SUTURE) ×2 IMPLANT
SUT MNCRL 0 VIOLET 6X18 (SUTURE) ×1 IMPLANT
SUT MNCRL AB 0 CT1 27 (SUTURE) ×2 IMPLANT
SUT MON AB 2-0 CT1 36 (SUTURE) IMPLANT
SUT MONOCRYL 0 6X18 (SUTURE) ×2
SUT MONOCRYL 0 MO 4 18  CR/8 (SUTURE) ×6
SUT PDS AB 0 CT1 27 (SUTURE) ×6 IMPLANT
SUT VIC AB 2-0 CT1 (SUTURE) ×2 IMPLANT
SUT VICRYL 0 UR6 27IN ABS (SUTURE) ×3 IMPLANT
SUT VICRYL RAPIDE 3 0 (SUTURE) ×3 IMPLANT
TOWEL OR 17X24 6PK STRL BLUE (TOWEL DISPOSABLE) ×10 IMPLANT
TRAY FOLEY BAG SILVER LF 16FR (CATHETERS) ×2 IMPLANT
TRAY FOLEY CATH 14FR (SET/KITS/TRAYS/PACK) ×3 IMPLANT
TROCAR OPTI TIP 5M 100M (ENDOMECHANICALS) ×3 IMPLANT
TROCAR XCEL DIL TIP R 11M (ENDOMECHANICALS) ×3 IMPLANT
TUBING SUCTION BULK 100 FT (MISCELLANEOUS) ×2 IMPLANT
WARMER LAPAROSCOPE (MISCELLANEOUS) ×3 IMPLANT
WATER STERILE IRR 1000ML POUR (IV SOLUTION) ×3 IMPLANT
YANKAUER SUCT BULB TIP NO VENT (SUCTIONS) ×2 IMPLANT

## 2014-06-23 NOTE — Transfer of Care (Signed)
Immediate Anesthesia Transfer of Care Note  Patient: Miranda Dudley  Procedure(s) Performed: Procedure(s): Attempted LAPAROSCOPIC ASSISTED VAGINAL HYSTERECTOMY AND BILATERAL SALPINGO OOPHORECTOMY (N/A) HYSTERECTOMY ABDOMINAL (N/A)  Patient Location: PACU  Anesthesia Type:General  Level of Consciousness: awake and oriented  Airway & Oxygen Therapy: Patient Spontanous Breathing and Patient connected to nasal cannula oxygen  Post-op Assessment: Report given to PACU RN and Post -op Vital signs reviewed and stable  Post vital signs: Reviewed and stable  Complications: No apparent anesthesia complications

## 2014-06-23 NOTE — Addendum Note (Signed)
Addendum created 06/23/14 1641 by Brock Ra, CRNA   Modules edited: Notes Section   Notes Section:  File: [id number]

## 2014-06-23 NOTE — Anesthesia Preprocedure Evaluation (Signed)
Anesthesia Evaluation  Patient identified by MRN, date of birth, ID band Patient awake    Reviewed: Allergy & Precautions, H&P , Patient's Chart, lab work & pertinent test results, reviewed documented beta blocker date and time   History of Anesthesia Complications Negative for: history of anesthetic complications  Airway Mallampati: II TM Distance: >3 FB Neck ROM: full    Dental   Pulmonary  breath sounds clear to auscultation        Cardiovascular Exercise Tolerance: Good hypertension, Rhythm:regular Rate:Normal     Neuro/Psych  Headaches, Anxiety    GI/Hepatic   Endo/Other  diabetesHypothyroidism Morbid obesity  Renal/GU      Musculoskeletal   Abdominal   Peds  Hematology  (+) anemia ,   Anesthesia Other Findings   Reproductive/Obstetrics                           Anesthesia Physical Anesthesia Plan  ASA: III  Anesthesia Plan: General ETT   Post-op Pain Management:    Induction:   Airway Management Planned:   Additional Equipment:   Intra-op Plan:   Post-operative Plan:   Informed Consent: I have reviewed the patients History and Physical, chart, labs and discussed the procedure including the risks, benefits and alternatives for the proposed anesthesia with the patient or authorized representative who has indicated his/her understanding and acceptance.   Dental Advisory Given  Plan Discussed with: CRNA and Surgeon  Anesthesia Plan Comments:         Anesthesia Quick Evaluation

## 2014-06-23 NOTE — Anesthesia Postprocedure Evaluation (Signed)
  Anesthesia Post-op Note  Patient: Miranda Dudley  Procedure(s) Performed: Procedure(s): Attempted LAPAROSCOPIC ASSISTED VAGINAL HYSTERECTOMY AND BILATERAL SALPINGO OOPHORECTOMY (N/A) HYSTERECTOMY ABDOMINAL (N/A)  Patient Location: [facility]  Anesthesia Type:General  Level of Consciousness: awake, alert , oriented and patient cooperative  Airway and Oxygen Therapy: Patient Spontanous Breathing and Patient connected to nasal cannula oxygen  Post-op Pain: mild  Post-op Assessment: Post-op Vital signs reviewed, Patient's Cardiovascular Status Stable, Respiratory Function Stable, Patent Airway, No signs of Nausea or vomiting, Adequate PO intake and Pain level controlled  Post-op Vital Signs: Reviewed and stable  Last Vitals:  Filed Vitals:   06/23/14 1415  BP: 142/90  Pulse: 90  Temp: 36.4 C  Resp: 20    Complications: No apparent anesthesia complications

## 2014-06-23 NOTE — Anesthesia Postprocedure Evaluation (Signed)
Anesthesia Post Note  Patient: Miranda Dudley  Procedure(s) Performed: Procedure(s) (LRB): Attempted LAPAROSCOPIC ASSISTED VAGINAL HYSTERECTOMY AND BILATERAL SALPINGO OOPHORECTOMY (N/A) HYSTERECTOMY ABDOMINAL (N/A)  Anesthesia type: General  Patient location: PACU  Post pain: Pain level controlled  Post assessment: Post-op Vital signs reviewed  Last Vitals:  Filed Vitals:   06/23/14 1030  BP:   Pulse: 70  Temp:   Resp: 22    Post vital signs: Reviewed  Level of consciousness: sedated  Complications: No apparent anesthesia complications

## 2014-06-23 NOTE — Brief Op Note (Signed)
06/23/2014  9:16 AM  PATIENT:  Miranda Dudley  53 y.o. female  PRE-OPERATIVE DIAGNOSIS:  pelvic pain, fibroids, right ovarian mass, left ovarian cyst  POST-OPERATIVE DIAGNOSIS:  pelvic pain, fibroids, right ovarian mass, left ovarian cyst  PROCEDURE:  Procedure(s): Attempted LAPAROSCOPIC ASSISTED VAGINAL HYSTERECTOMY AND BILATERAL SALPINGO OOPHORECTOMY (N/A) HYSTERECTOMY ABDOMINAL (N/A)  SURGEON:  Surgeon(s) and Role:    * Luz Lex, MD - Primary    * W Evette Cristal, MD - Assisting  PHYSICIAN ASSISTANT:   ASSISTANTS: Neal   ANESTHESIA:   regional and general  EBL:  Total I/O In: 2000 [I.V.:2000] Out: 750 [Urine:150; Blood:600]  BLOOD ADMINISTERED:none  DRAINS: Urinary Catheter (Foley)   LOCAL MEDICATIONS USED:  MARCAINE     SPECIMEN:  Source of Specimen:  uterus and both tubes and ovaries  DISPOSITION OF SPECIMEN:  PATHOLOGY  COUNTS:  YES  TOURNIQUET:  * No tourniquets in log *  DICTATION: .Other Dictation: Dictation Number 1  PLAN OF CARE: Admit to inpatient   PATIENT DISPOSITION:  PACU - hemodynamically stable.   Delay start of Pharmacological VTE agent (>24hrs) due to surgical blood loss or risk of bleeding: no

## 2014-06-23 NOTE — Anesthesia Procedure Notes (Signed)
Procedure Name: Intubation Date/Time: 06/23/2014 7:35 AM Performed by: Bufford Spikes Pre-anesthesia Checklist: Patient identified, Emergency Drugs available, Suction available, Patient being monitored and Timeout performed Patient Re-evaluated:Patient Re-evaluated prior to inductionOxygen Delivery Method: Circle system utilized Preoxygenation: Pre-oxygenation with 100% oxygen Intubation Type: IV induction Ventilation: Mask ventilation without difficulty Laryngoscope Size: Miller and 2 Grade View: Grade I Tube type: Oral Tube size: 7.0 mm Number of attempts: 1 Placement Confirmation: ETT inserted through vocal cords under direct vision,  positive ETCO2 and breath sounds checked- equal and bilateral Secured at: 22 cm Tube secured with: Tape Dental Injury: Teeth and Oropharynx as per pre-operative assessment

## 2014-06-24 ENCOUNTER — Encounter (HOSPITAL_COMMUNITY): Payer: Self-pay | Admitting: Obstetrics and Gynecology

## 2014-06-24 LAB — CBC
HEMATOCRIT: 33.9 % — AB (ref 36.0–46.0)
HEMOGLOBIN: 11 g/dL — AB (ref 12.0–15.0)
MCH: 27.6 pg (ref 26.0–34.0)
MCHC: 32.4 g/dL (ref 30.0–36.0)
MCV: 85.2 fL (ref 78.0–100.0)
Platelets: 259 10*3/uL (ref 150–400)
RBC: 3.98 MIL/uL (ref 3.87–5.11)
RDW: 14.7 % (ref 11.5–15.5)
WBC: 7.9 10*3/uL (ref 4.0–10.5)

## 2014-06-24 MED ORDER — INFLUENZA VAC SPLIT QUAD 0.5 ML IM SUSY: 0.5000 mL | PREFILLED_SYRINGE | INTRAMUSCULAR | Status: DC

## 2014-06-24 NOTE — Progress Notes (Signed)
Subjective: Patient reports tolerating PO and no problems voiding.    Objective: I have reviewed patient's vital signs, intake and output, medications and labs.  General: alert, cooperative, appears stated age and mild distress GI: soft, non-tender; bowel sounds normal; no masses,  no organomegaly and incision: clean, dry and intact neg homans   Assessment/Plan: Post op D 1 Doing great  Advance diet and change to po meds    LOS: 1 day    Miranda Dudley C 06/24/2014, 9:38 AM

## 2014-06-25 MED ORDER — IBUPROFEN 600 MG PO TABS: 600.0000 mg | ORAL_TABLET | Freq: Four times a day (QID) | ORAL | Status: DC | PRN

## 2014-06-25 MED ORDER — OXYCODONE-ACETAMINOPHEN 5-325 MG PO TABS: 1.0000 | ORAL_TABLET | ORAL | Status: DC | PRN

## 2014-06-25 NOTE — Progress Notes (Signed)
D/c instructions and prescriptions reviewed with pt and family. Pt has a f/u appt on 9-15 with Dr. Corinna Capra. Pt d/c home stable, in [facility] with family to private car.

## 2014-06-25 NOTE — Discharge Summary (Signed)
Physician Discharge Summary  Patient ID: Miranda Dudley MRN: [id number] DOB/AGE: [birth year] 53 y.o.  Admit date: 06/23/2014 Discharge date: 06/25/2014  Admission Diagnoses:AUB< Pelvic pain, fibroids, Ovarian cysts  Discharge Diagnoses: Same Active Problems:   S/P TAH-BSO   Discharged Condition: good  Hospital Course: Pt underwent uncomplicated TAH/BSO with normal post op course.  Post op D 2 tolerating regular diet, pain well controlled with oral meds, and voiding on own and desired d/c home  Consults: None  Significant Diagnostic Studies: labs: Hgb 11  Treatments: surgery: TAH BSO  Discharge Exam: Blood pressure 125/76, pulse 79, temperature 98.2 F (36.8 C), temperature source Oral, resp. rate 18, height 5\' 4"  (1.626 m), weight 124.739 kg (275 lb), SpO2 95.00%. General appearance: alert, cooperative and no distress Incsion CD&I  Disposition:   Discharge Instructions   Call MD for:  difficulty breathing, headache or visual disturbances    Complete by:  As directed      Call MD for:  persistant nausea and vomiting    Complete by:  As directed      Call MD for:  redness, tenderness, or signs of infection (pain, swelling, redness, odor or green/yellow discharge around incision site)    Complete by:  As directed      Call MD for:  severe uncontrolled pain    Complete by:  As directed      Call MD for:  temperature >100.4    Complete by:  As directed      Diet general    Complete by:  As directed      Discharge instructions    Complete by:  As directed   Call for appt in 3-4 days for staple removal     Driving Restrictions    Complete by:  As directed   No driving for 2 weeks     Increase activity slowly    Complete by:  As directed      Lifting restrictions    Complete by:  As directed   No lifting anything greater than 10 pounds (if you have to ask, don't lift it)     Sexual Activity Restrictions    Complete by:  As directed   Nothing in the vagina for 6 weeks             Medication List         albuterol 108 (90 BASE) MCG/ACT inhaler  Commonly known as:  PROVENTIL HFA;VENTOLIN HFA  Inhale into the lungs every 6 (six) hours as needed for wheezing or shortness of breath.     Cholecalciferol 1000 UNITS tablet  Take 1,000 Units by mouth daily.     hydrochlorothiazide 25 MG tablet  Commonly known as:  HYDRODIURIL  Take 12.5 mg by mouth daily.     ibuprofen 600 MG tablet  Commonly known as:  ADVIL,MOTRIN  Take 1 tablet (600 mg total) by mouth every 6 (six) hours as needed (mild pain).     ketoprofen 75 MG capsule  Commonly known as:  ORUDIS  Take 75 mg by mouth every 8 (eight) hours as needed (headache).     levothyroxine 75 MCG tablet  Commonly known as:  SYNTHROID, LEVOTHROID  Take 75 mcg by mouth daily before breakfast.     multivitamin tablet  Take 1 tablet by mouth daily.     oxyCODONE-acetaminophen 5-325 MG per tablet  Commonly known as:  PERCOCET/ROXICET  Take 1-2 tablets by mouth every 4 (four) hours as needed for severe pain (  moderate to severe pain (when tolerating fluids)).     triamcinolone cream 0.1 %  Commonly known as:  KENALOG  Apply topically 3 (three) times daily. As needed for rash         Signed: Marlowe Lawes C 06/25/2014, 8:58 AM

## 2014-06-27 NOTE — Op Note (Signed)
NAME:  Miranda Dudley, Miranda Dudley                      ACCOUNT NO.:  MEDICAL RECORD NO.:  [id number]  LOCATION:                                 FACILITY:  PHYSICIAN:  Monia Sabal. Corinna Capra, M.D.    DATE OF BIRTH:  1960/11/20  DATE OF PROCEDURE:  06/23/2014 DATE OF DISCHARGE:                              OPERATIVE REPORT   PREOPERATIVE DIAGNOSES:  Pelvic pain, bilateral adnexal masses, suspicious for dermoids, uterine fibroids, and abnormal uterine bleeding.  POSTOPERATIVE DIAGNOSES:  Pelvic pain, bilateral adnexal masses, suspicious for dermoids, uterine fibroids, and abnormal uterine bleeding.  PROCEDURE:  Attempted laparoscopic-assisted vaginal hysterectomy converted to total abdominal hysterectomy with bilateral salpingo- oophorectomy.  SURGEON:  Monia Sabal. Corinna Capra, MD  ASSISTANT:  Maisie Fus, MD  ANESTHESIA:  General endotracheal.  INDICATIONS:  Mrs. Helwig is a 53 year old, perimenopausal black woman with bilateral adnexal masses.  The patient's recent ultrasound showing a 2.1-cm right complex mass with an 18 mm area within this, consistent with a dermoid, possible left ovarian 2.3 complex mass, small area suspicion for dermoid.  She has had a normal CA-125.  She has multiple fibroids, the largest measuring 5 cm, the largest one abutting the endometrium.  She has had an endometrial ablation which is no longer functioning for her.  She is now beginning to having more problems with pain and bleeding and irregular cycles at this time.  She desires definitive surgical intervention, requests hysterectomy, had a normal CA- 125.  Recent FSH was perimenopausal at 45.  Plan, attempted laparoscopic- assisted vaginal hysterectomy with removal of both tubes and ovaries, possible conversion to abdominal hysterectomy due to size and also history of previous surgeries.  Risks and benefits were discussed at length.  Informed consent was obtained.  FINDINGS AT THE TIME OF SURGERY:  Bilateral adnexal masses  consistent with dermoid, adhesions in the right adnexal area, enlarged uterus consistent with fibroids.  Normal-appearing appendix and normal- appearing liver.  DESCRIPTION OF PROCEDURE:  After adequate analgesia, the patient was placed in dorsal lithotomy position.  She was sterilely prepped and draped.  Bladder was sterilely drained.  Graves speculum was placed. Tenaculum was placed in anterior lip of the cervix, 1 cm infraumbilical skin incision was made.  A Veress needle was inserted.  The abdomen was insufflated, dullness to percussion.  An 11 mm trocar was inserted.  The above findings were noted.  The 5 mm trocar was inserted left of the midline, 2 fingerbreadths above the pubic symphysis under direct visualization.  After careful and systematic evaluation of the abdomen and pelvis, Gyrus cutting forceps was used to ligate and dissect across the left infundibulopelvic ligament with the left tube and ovary falling towards the uterus, this was done similarly across the right side; however due to the size of both the ovary and the uterus, unable to adequately visualize after beginning to have dissection and began having bleeding from this area and unable to achieve good hemostasis due to the position of the uterus and the ovary.  Has several small blood vessels retracted within the uterus just below the broad ligament.  Because of this, we went ahead and converted  to abdominal hysterectomy.  The trocars removed.  The abdomen desufflated.  A Pfannenstiel skin incision was made 2 fingerbreadths above pubic symphysis taken down sharply.  The fascia was incised transversely, extended superiorly and inferiorly off the bellies and rectus muscle which separated sharply in the midline. Peritoneum was entered sharply.  Fredia Sorrow retractor was placed.  The area of bleed was quickly noted and pressure was applied. A Kelly clamp was placed across the infundibulopelvic ligaments  and across the utero-ovarian ligament, with good hemostasis achieved at this time and only approximately 200 mL blood loss in the process.  The round ligaments were dissected bilaterally down to the uterine vessels.  The bladder flap was created, removed the bladder from the anterior surface of the cervix.  LigaSure instrument was used to ligate across the uterine arteries bilaterally down across the cardinal ligaments.  Then this was done bilaterally and then the Heaney clamps were placed across the uterosacral ligaments bilaterally.  The vagina was entered sharply and the uterus and cervix were removed with the cervix noted to be intact, 0 Monocryl sutures were used to secure the uterosacral ligaments and the angles of the vagina in a figure-of-eight fashion and the vagina was then closed with figure-of-eights of 0 Monocryl suture.  Examination revealed good hemostasis.  The uterosacral ligaments were plicated in the midline.  Reexamination of the pedicles revealed good hemostasis, able to palpate the ureters well from the dissection field, good peristalsis was noted.  After irrigation was applied and adequate hemostasis was assured, the packing was removed as was the [employer].  The peritoneum was closed with 0 Monocryl suture in a running fashion.  Rectus muscle plicated in the midline.  The fascia was then closed with a 0 PDS running suture with good approximation, good hemostasis noted.  Skin was then stapled.  Steri- Strips applied.  The patient tolerated the procedure well, was stable and transferred to recovery room.  Sponge and instrument count was normal x3.  Estimated blood loss was 600 mL.  The umbilical incision was closed with 0 Vicryl interrupted suture, the fascia with 3-0 Vicryl Rapide subcuticular suture.  Again the patient was stable on transfer to recovery room.     Monia Sabal Corinna Capra, M.D.     DCL/MEDQ  D:  06/26/2014  T:  06/26/2014  Job:   [id number]

## 2014-07-14 ENCOUNTER — Ambulatory Visit: Payer: Managed Care, Other (non HMO) | Admitting: Endocrinology

## 2014-07-21 ENCOUNTER — Encounter: Payer: Self-pay | Admitting: Endocrinology

## 2014-07-21 ENCOUNTER — Ambulatory Visit (INDEPENDENT_AMBULATORY_CARE_PROVIDER_SITE_OTHER): Payer: Managed Care, Other (non HMO) | Admitting: Endocrinology

## 2014-07-21 VITALS — BP 116/88 | HR 86 | Temp 98.3°F | Ht 64.0 in | Wt 278.0 lb

## 2014-07-21 DIAGNOSIS — Z23 Encounter for immunization: Secondary | ICD-10-CM

## 2014-07-21 DIAGNOSIS — E042 Nontoxic multinodular goiter: Secondary | ICD-10-CM

## 2014-07-21 DIAGNOSIS — E559 Vitamin D deficiency, unspecified: Secondary | ICD-10-CM | POA: Insufficient documentation

## 2014-07-21 DIAGNOSIS — E119 Type 2 diabetes mellitus without complications: Secondary | ICD-10-CM

## 2014-07-21 LAB — TSH: TSH: 0.51 u[IU]/mL (ref 0.35–4.50)

## 2014-07-21 LAB — MICROALBUMIN / CREATININE URINE RATIO
Creatinine,U: 145.2 mg/dL
MICROALB/CREAT RATIO: 0.7 mg/g (ref 0.0–30.0)
Microalb, Ur: 1 mg/dL (ref 0.0–1.9)

## 2014-07-21 LAB — VITAMIN D 25 HYDROXY (VIT D DEFICIENCY, FRACTURES): VITD: 35.91 ng/mL (ref 30.00–100.00)

## 2014-07-21 LAB — HEMOGLOBIN A1C: Hgb A1c MFr Bld: 6.3 % (ref 4.6–6.5)

## 2014-07-21 NOTE — Patient Instructions (Addendum)
please call [phone] (old office), to get an appointment with a new primary doctor. check your blood sugar once a day.  vary the time of day when you check, between before the 3 meals, and at bedtime.  also check if you have symptoms of your blood sugar being too high or too low.  please keep a record of the readings and bring it to your next appointment here.  You can write it on any piece of paper.  please call us sooner if your blood sugar goes below 70, or if you have a lot of readings over 200. blood and urine tests are being requested for you today.  We'll contact you with results. Please come back for a follow-up appointment in 6 months

## 2014-07-21 NOTE — Progress Notes (Signed)
Subjective:    Patient ID: Miranda Dudley, female    DOB: 10/05/1961, 53 y.o.   MRN: [id number]  HPI The state of at least three ongoing medical problems is addressed today, with interval history of each noted here: Pt returns for f/u of diabetes mellitus: DM type: 2 Dx'ed: 4315 Complications: none Therapy: insulin since GDM: never DKA: never Severe hypoglycemia: never Pancreatitis: never Other: she has not required medication Interval history: She has weight gain Thyroid cancer: she does not notice any nodule at the neck. 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 Past Medical History  Diagnosis Date  . UNSPECIFIED IRON DEFICIENCY ANEMIA 08/20/2007  . NONSPECIFIC ABNORMAL ELECTROCARDIOGRAM 08/20/2007  . IRON DEFICIENCY ANEMIA SECONDARY TO BLOOD LOSS 11/29/2008  . HYPERLIPIDEMIA 08/19/2007  . HYPERCHOLESTEROLEMIA 05/04/2010  . Headache(784.0) 08/20/2007  . GOITER, MULTINODULAR 08/19/2007  . DIABETES MELLITUS, TYPE II 08/19/2007  . 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 ANXIETY 05/04/2010  . Migraine headache     Past Surgical History  Procedure Laterality Date  . Tubal ligation  2005  . Thyroidectomy  09/2003    left   . Laparoscopic assisted vaginal hysterectomy N/A 06/23/2014    Procedure: Attempted LAPAROSCOPIC ASSISTED VAGINAL HYSTERECTOMY AND BILATERAL SALPINGO OOPHORECTOMY;  Surgeon: Luz Lex, MD;  Location: [facility];  Service: Gynecology;  Laterality: N/A;  . Abdominal hysterectomy N/A 06/23/2014    Procedure: HYSTERECTOMY ABDOMINAL;  Surgeon: Luz Lex, MD;  Location: [facility];  Service: Gynecology;  Laterality: N/A;    History   Social History  . Marital Status: Married    Spouse Name: N/A    Number of Children: N/A  . Years of Education: N/A   Occupational History  . Banking    Social History Main Topics  . Smoking status: Never Smoker   . Smokeless tobacco: Never Used  . Alcohol  Use: Yes  . Drug Use: No  . Sexual Activity: Not on file   Other Topics Concern  . Not on file   Social History Narrative  . No narrative on file    Current Outpatient Prescriptions on File Prior to Visit  Medication Sig Dispense Refill  . albuterol (PROVENTIL HFA;VENTOLIN HFA) 108 (90 BASE) MCG/ACT inhaler Inhale into the lungs every 6 (six) hours as needed for wheezing or shortness of breath.      . Cholecalciferol 1000 UNITS tablet Take 1,000 Units by mouth daily.      . hydrochlorothiazide (HYDRODIURIL) 25 MG tablet Take 12.5 mg by mouth daily.      Marland Kitchen ketoprofen (ORUDIS) 75 MG capsule Take 75 mg by mouth every 8 (eight) hours as needed (headache).      Marland Kitchen levothyroxine (SYNTHROID, LEVOTHROID) 75 MCG tablet Take 75 mcg by mouth daily before breakfast.      . Multiple Vitamin (MULTIVITAMIN) tablet Take 1 tablet by mouth daily.      Marland Kitchen triamcinolone cream (KENALOG) 0.1 % Apply topically 3 (three) times daily. As needed for rash  30 g  0   No current facility-administered medications on file prior to visit.    Allergies  Allergen Reactions  . Latex     Hives     Family History  Problem Relation Age of Onset  . Cancer Neg Hx    BP 116/88  Pulse 86  Temp(Src) 98.3 F (36.8 C) (Oral)  Ht 5\' 4"  (1.626 m)  Wt 278 lb (126.1 kg)  BMI 47.70 kg/m2  SpO2 97%  Review of Systems Denies neck pain and numbness.     Objective:   Physical Exam VITAL SIGNS:  See vs page GENERAL: no distress Neck: a healed scar is present.  i do not appreciate a nodule in the thyroid or elsewhere in the neck Pulses: dorsalis pedis intact bilat.   Feet: no deformity.  no edema Skin:  no ulcer on the feet.  normal color and temp. Neuro: sensation is intact to touch on the feet.   Lab Results  Component Value Date   PTH 69* 07/21/2014   CALCIUM 9.0 07/21/2014   Lab Results  Component Value Date   HGBA1C 6.3 07/21/2014   Lab Results  Component Value Date   TSH 0.51 07/21/2014    Vit-D=normal    Assessment & Plan:  Postsurgical hypothyroidism, well-replaced DM: well-controlled Vit-D deficiency, well-replaced Weight gain: pt is advised to re-lose   Patient is advised the following: Patient Instructions  please call [phone] (old office), to get an appointment with a new primary doctor. check your blood sugar once a day.  vary the time of day when you check, between before the 3 meals, and at bedtime.  also check if you have symptoms of your blood sugar being too high or too low.  please keep a record of the readings and bring it to your next appointment here.  You can write it on any piece of paper.  please call us sooner if your blood sugar goes below 70, or if you have a lot of readings over 200. blood and urine tests are being requested for you today.  We'll contact you with results. Please come back for a follow-up appointment in 6 months  same rx is advised

## 2014-07-22 LAB — PTH, INTACT AND CALCIUM
CALCIUM: 9 mg/dL (ref 8.4–10.5)
PTH: 69 pg/mL — ABNORMAL HIGH (ref 14–64)

## 2014-09-12 ENCOUNTER — Other Ambulatory Visit: Payer: Self-pay | Admitting: Endocrinology

## 2014-12-10 ENCOUNTER — Other Ambulatory Visit: Payer: Self-pay | Admitting: Endocrinology

## 2015-01-18 ENCOUNTER — Telehealth: Payer: Self-pay

## 2015-01-18 NOTE — Telephone Encounter (Signed)
Yes, type 2 DM.  At next ov (which is due) we can recheck.

## 2015-01-18 NOTE — Telephone Encounter (Signed)
Pt advised of note below. Pt scheduled for 02/03/2015.

## 2015-01-18 NOTE — Telephone Encounter (Signed)
Pt called concerned about her type 2 DM diagnosis. Pt states she was never advised of this diagnosis. Pt wanted to know if she should have lab work done to check on this and if she should be on any medications at this time? Please advise, Thanks!

## 2015-01-30 ENCOUNTER — Other Ambulatory Visit: Payer: Self-pay | Admitting: Endocrinology

## 2015-02-03 ENCOUNTER — Ambulatory Visit (INDEPENDENT_AMBULATORY_CARE_PROVIDER_SITE_OTHER): Payer: Managed Care, Other (non HMO) | Admitting: Endocrinology

## 2015-02-03 ENCOUNTER — Encounter: Payer: Self-pay | Admitting: Endocrinology

## 2015-02-03 VITALS — BP 130/84 | HR 91 | Temp 98.1°F | Ht 64.0 in | Wt 276.0 lb

## 2015-02-03 DIAGNOSIS — E890 Postprocedural hypothyroidism: Secondary | ICD-10-CM | POA: Diagnosis not present

## 2015-02-03 DIAGNOSIS — E119 Type 2 diabetes mellitus without complications: Secondary | ICD-10-CM

## 2015-02-03 LAB — HEMOGLOBIN A1C: Hgb A1c MFr Bld: 6.4 % (ref 4.6–6.5)

## 2015-02-03 LAB — TSH: TSH: 1.64 u[IU]/mL (ref 0.35–4.50)

## 2015-02-03 MED ORDER — TRIAMCINOLONE ACETONIDE 0.1 % EX CREA: TOPICAL_CREAM | Freq: Three times a day (TID) | CUTANEOUS | Status: DC

## 2015-02-03 NOTE — Progress Notes (Signed)
Subjective:    Patient ID: Miranda Dudley, female    DOB: 1961/03/28, 54 y.o.   MRN: [id number]  HPI The state of at least three ongoing medical problems is addressed today, with interval history of each noted here: Pt returns for f/u of diabetes mellitus: DM type: 2 Dx'ed: 9628 Complications: none Therapy: none. GDM: never DKA: never Severe hypoglycemia: never Pancreatitis: never Other: she has not required medication Interval history: She has ongoing weight gain.  Thyroid cancer: 2004: left lobectomy- - 0.3 cm focus of papillary cancer she does not notice any nodule at the neck. Postsurgical hypothyroidism: she has fatigue.  Past Medical History  Diagnosis Date  . UNSPECIFIED IRON DEFICIENCY ANEMIA 08/20/2007  . NONSPECIFIC ABNORMAL ELECTROCARDIOGRAM 08/20/2007  . IRON DEFICIENCY ANEMIA SECONDARY TO BLOOD LOSS 11/29/2008  . HYPERLIPIDEMIA 08/19/2007  . HYPERCHOLESTEROLEMIA 05/04/2010  . Headache(784.0) 08/20/2007  . GOITER, MULTINODULAR 08/19/2007  . DIABETES MELLITUS, TYPE II 08/19/2007  . 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 ANXIETY 05/04/2010  . Migraine headache     Past Surgical History  Procedure Laterality Date  . Tubal ligation  2005  . Thyroidectomy  09/2003    left   . Laparoscopic assisted vaginal hysterectomy N/A 06/23/2014    Procedure: Attempted LAPAROSCOPIC ASSISTED VAGINAL HYSTERECTOMY AND BILATERAL SALPINGO OOPHORECTOMY;  Surgeon: Luz Lex, MD;  Location: [facility];  Service: Gynecology;  Laterality: N/A;  . Abdominal hysterectomy N/A 06/23/2014    Procedure: HYSTERECTOMY ABDOMINAL;  Surgeon: Luz Lex, MD;  Location: [facility];  Service: Gynecology;  Laterality: N/A;    History   Social History  . Marital Status: Married    Spouse Name: N/A  . Number of Children: N/A  . Years of Education: N/A   Occupational History  . Banking    Social History Main Topics  . Smoking status:  Never Smoker   . Smokeless tobacco: Never Used  . Alcohol Use: Yes  . Drug Use: No  . Sexual Activity: Not on file   Other Topics Concern  . Not on file   Social History Narrative    Current Outpatient Prescriptions on File Prior to Visit  Medication Sig Dispense Refill  . albuterol (PROVENTIL HFA;VENTOLIN HFA) 108 (90 BASE) MCG/ACT inhaler Inhale into the lungs every 6 (six) hours as needed for wheezing or shortness of breath.    . Cholecalciferol 1000 UNITS tablet Take 1,000 Units by mouth daily.    Marland Kitchen ketoprofen (ORUDIS) 75 MG capsule Take 75 mg by mouth every 8 (eight) hours as needed (headache).    Marland Kitchen levothyroxine (SYNTHROID, LEVOTHROID) 75 MCG tablet TAKE 1 TABLET (75 MCG TOTAL) BY MOUTH DAILY BEFORE BREAKFAST. 30 tablet 1  . Multiple Vitamin (MULTIVITAMIN) tablet Take 1 tablet by mouth daily.    . hydrochlorothiazide (HYDRODIURIL) 25 MG tablet Take 12.5 mg by mouth daily.     No current facility-administered medications on file prior to visit.    Allergies  Allergen Reactions  . Latex     Hives     Family History  Problem Relation Age of Onset  . Cancer Neg Hx     BP 130/84 mmHg  Pulse 91  Temp(Src) 98.1 F (36.7 C) (Oral)  Ht 5\' 4"  (1.626 m)  Wt 276 lb (125.193 kg)  BMI 47.35 kg/m2  SpO2 96%   Review of Systems She has diffuse arthralgias.  Denies numbness of the feet.  Objective:   Physical Exam VITAL SIGNS:  See vs page GENERAL: no distress Neck: a healed scar is present.  i do not appreciate a nodule in the thyroid or elsewhere in the neck Pulses: dorsalis pedis intact bilat.   MSK: no deformity of the feet CV: no leg edema Skin:  no ulcer on the feet.  normal color and temp on the feet. Neuro: sensation is intact to touch on the feet.  Lab Results  Component Value Date   HGBA1C 6.4 02/03/2015   Lab Results  Component Value Date   TSH 1.64 02/03/2015      Assessment & Plan:  Postsurgical hypothyroidism: well-replaced Thyroid cancer.   No clinical evidence of recurrence.  All she needs is annual physical exam of the thyroid. DM: well-controlled on no medication Obesity: persistent.   Patient is advised the following: Patient Instructions  blood tests are requested for you today.  We'll let you know about the results. If these tests are normal, all you need is to have Dr Chapman Fitch check your a1c and thyroid periodically.  I would be happy to see you back here whenever you want.   Please consider having weight loss surgery.  It is good for your health.  Here is some information about it.  If you decide to consider further, please call the phone number in the papers, and register for a free informational meeting.

## 2015-02-03 NOTE — Patient Instructions (Signed)
blood tests are requested for you today.  We'll let you know about the results. If these tests are normal, all you need is to have Dr Chapman Fitch check your a1c and thyroid periodically.  I would be happy to see you back here whenever you want.   Please consider having weight loss surgery.  It is good for your health.  Here is some information about it.  If you decide to consider further, please call the phone number in the papers, and register for a free informational meeting.

## 2015-04-20 ENCOUNTER — Other Ambulatory Visit: Payer: Self-pay | Admitting: Endocrinology

## 2015-05-27 ENCOUNTER — Other Ambulatory Visit: Payer: Self-pay | Admitting: Endocrinology

## 2015-06-26 ENCOUNTER — Other Ambulatory Visit: Payer: Self-pay | Admitting: Sports Medicine

## 2015-06-26 DIAGNOSIS — M542 Cervicalgia: Secondary | ICD-10-CM

## 2015-07-03 ENCOUNTER — Other Ambulatory Visit: Payer: Managed Care, Other (non HMO)

## 2015-07-13 ENCOUNTER — Encounter (INDEPENDENT_AMBULATORY_CARE_PROVIDER_SITE_OTHER): Payer: Self-pay | Admitting: Diagnostic Neuroimaging

## 2015-07-13 ENCOUNTER — Ambulatory Visit (INDEPENDENT_AMBULATORY_CARE_PROVIDER_SITE_OTHER): Payer: BLUE CROSS/BLUE SHIELD | Admitting: Diagnostic Neuroimaging

## 2015-07-13 DIAGNOSIS — R202 Paresthesia of skin: Secondary | ICD-10-CM

## 2015-07-13 DIAGNOSIS — Z0289 Encounter for other administrative examinations: Secondary | ICD-10-CM

## 2015-07-13 DIAGNOSIS — R200 Anesthesia of skin: Secondary | ICD-10-CM

## 2015-07-13 NOTE — Procedures (Signed)
   [facility]  NCS (NERVE CONDUCTION STUDY) WITH EMG (ELECTROMYOGRAPHY) REPORT   STUDY DATE: 07/13/15 PATIENT NAME: Miranda Dudley DOB: [birth year] MRN: [id number]  ORDERING CLINICIAN: Berle Mull  TECHNOLOGIST: Laretta Alstrom  ELECTROMYOGRAPHER: Earlean Polka. Antavious Spanos, MD  CLINICAL INFORMATION: 54 year old female with right hand numbness. Evaluate for carpal tunnel syndrome.  FINDINGS: NERVE CONDUCTION STUDY: Left median and bilateral ulnar motor responses and F wave latencies are normal. Right median motor response is prolonged distal latency (5.9 ms) decreased amplitude, normal conduction velocity and normal F-wave latency.  Left median and bilateral ulnar sensory responses are normal. Right median sensory response has normal amplitude and relatively prolonged distal latency compared to the left median and right ulnar sensory responses.   NEEDLE ELECTROMYOGRAPHY: Needle examination of right upper extremity deltoid, biceps, triceps, flexor carpi radialis, first dorsal interosseous and right C6-7 paraspinal muscles is normal.   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   INTERPRETING PHYSICIAN:  Penni Bombard, MD Certified in Neurology, Neurophysiology and Neuroimaging  [facility] [address], [place], [place] [address] [phone]

## 2015-07-20 ENCOUNTER — Other Ambulatory Visit: Payer: Self-pay

## 2015-07-20 DIAGNOSIS — R609 Edema, unspecified: Secondary | ICD-10-CM

## 2015-08-01 ENCOUNTER — Encounter: Payer: Self-pay | Admitting: Vascular Surgery

## 2015-08-02 ENCOUNTER — Ambulatory Visit (INDEPENDENT_AMBULATORY_CARE_PROVIDER_SITE_OTHER): Payer: BLUE CROSS/BLUE SHIELD | Admitting: Vascular Surgery

## 2015-08-02 ENCOUNTER — Encounter: Payer: Self-pay | Admitting: Vascular Surgery

## 2015-08-02 ENCOUNTER — Ambulatory Visit (HOSPITAL_COMMUNITY)
Admission: RE | Admit: 2015-08-02 | Discharge: 2015-08-02 | Disposition: A | Discharge status: Home / Self Care | Payer: BLUE CROSS/BLUE SHIELD | Source: Ambulatory Visit | Attending: Vascular Surgery | Admitting: Vascular Surgery

## 2015-08-02 VITALS — BP 138/92 | HR 88 | Ht 64.0 in | Wt 283.0 lb

## 2015-08-02 DIAGNOSIS — M7989 Other specified soft tissue disorders: Secondary | ICD-10-CM | POA: Diagnosis not present

## 2015-08-02 DIAGNOSIS — E785 Hyperlipidemia, unspecified: Secondary | ICD-10-CM | POA: Insufficient documentation

## 2015-08-02 DIAGNOSIS — I83893 Varicose veins of bilateral lower extremities with other complications: Secondary | ICD-10-CM | POA: Diagnosis not present

## 2015-08-02 DIAGNOSIS — E119 Type 2 diabetes mellitus without complications: Secondary | ICD-10-CM | POA: Insufficient documentation

## 2015-08-02 DIAGNOSIS — I83813 Varicose veins of bilateral lower extremities with pain: Secondary | ICD-10-CM | POA: Diagnosis not present

## 2015-08-02 DIAGNOSIS — R609 Edema, unspecified: Secondary | ICD-10-CM | POA: Diagnosis not present

## 2015-08-02 DIAGNOSIS — E7800 Pure hypercholesterolemia, unspecified: Secondary | ICD-10-CM | POA: Insufficient documentation

## 2015-08-02 NOTE — Progress Notes (Signed)
[facility] [place] HISTORY AND PHYSICAL   History of Present Illness:  Patient is a 54 y.o. year old female who presents for evaluation of a burning sensation in her legs as well as leg swelling. She states that her legs have swollen intermittently for several years. The swelling progresses as the day goes on. Swelling is relieved by elevation and essentially returns to baseline by morning. Patient also complains of pain in her knees. She was recently diagnosed with arthritis fibromyalgia and Sjogren's syndrome. These are currently stable. She denies prior history of DVT. She has not worn compression stockings in the past. She denies any prior lower extremity operations.  She has had recent weight gain and instrument with weight for most of her adult life. Other medical problems include diabetes, asthma hyperlipidemia are currently stable. Past Medical History  Diagnosis Date  . UNSPECIFIED IRON DEFICIENCY ANEMIA 08/20/2007  . NONSPECIFIC ABNORMAL ELECTROCARDIOGRAM 08/20/2007  . IRON DEFICIENCY ANEMIA SECONDARY TO BLOOD LOSS 11/29/2008  . HYPERLIPIDEMIA 08/19/2007  . HYPERCHOLESTEROLEMIA 05/04/2010  . Headache(784.0) 08/20/2007  . GOITER, MULTINODULAR 08/19/2007  . DIABETES MELLITUS, TYPE II 08/19/2007  . 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 ANXIETY 05/04/2010  . Migraine headache     Past Surgical History  Procedure Laterality Date  . Tubal ligation  2005  . Thyroidectomy  09/2003    left   . Laparoscopic assisted vaginal hysterectomy N/A 06/23/2014    Procedure: Attempted LAPAROSCOPIC ASSISTED VAGINAL HYSTERECTOMY AND BILATERAL SALPINGO OOPHORECTOMY;  Surgeon: Luz Lex, MD;  Location: [facility];  Service: Gynecology;  Laterality: N/A;  . Abdominal hysterectomy N/A 06/23/2014    Procedure: HYSTERECTOMY ABDOMINAL;  Surgeon: Luz Lex, MD;  Location: [facility];  Service: Gynecology;  Laterality: N/A;     Social History Social History  Substance Use Topics  . Smoking status: Never Smoker   . Smokeless tobacco: Never Used  . Alcohol Use: Yes    Family History Family History  Problem Relation Age of Onset  . Cancer Neg Hx     Allergies  Allergies  Allergen Reactions  . Latex     Hives      Current Outpatient Prescriptions  Medication Sig Dispense Refill  . albuterol (PROVENTIL HFA;VENTOLIN HFA) 108 (90 BASE) MCG/ACT inhaler Inhale into the lungs every 6 (six) hours as needed for wheezing or shortness of breath.    . estradiol (ESTRACE) 2 MG tablet   11  . ketoprofen (ORUDIS) 75 MG capsule Take 75 mg by mouth every 8 (eight) hours as needed (headache).    Marland Kitchen levothyroxine (SYNTHROID, LEVOTHROID) 75 MCG tablet TAKE 1 TABLET (75 MCG TOTAL) BY MOUTH DAILY BEFORE BREAKFAST. 30 tablet 4  . meloxicam (MOBIC) 15 MG tablet Take 15 mg by mouth daily.    . metFORMIN (GLUCOPHAGE) 500 MG tablet Take by mouth 2 (two) times daily with a meal.    . Multiple Vitamin (MULTIVITAMIN) tablet Take 1 tablet by mouth daily.    Marland Kitchen triamcinolone cream (KENALOG) 0.1 % Apply topically 3 (three) times daily. As needed for rash 30 g 0  . Cholecalciferol 1000 UNITS tablet Take 1,000 Units by mouth daily.    . hydrochlorothiazide (HYDRODIURIL) 25 MG tablet Take 12.5 mg by mouth daily.     No current facility-administered medications for this visit.    ROS:   General:  No weight loss, Fever, chills  HEENT: No recent headaches,  no nasal bleeding, no visual changes, no sore throat  Neurologic: No dizziness, blackouts, seizures. No recent symptoms of stroke or mini- stroke. No recent episodes of slurred speech, or temporary blindness.  Cardiac: No recent episodes of chest pain/pressure, no shortness of breath at rest.  No shortness of breath with exertion.  Denies history of atrial fibrillation or irregular heartbeat  Vascular: No history of rest pain in feet.  No history of claudication.  No history  of non-healing ulcer, No history of DVT   Pulmonary: No home oxygen, no productive cough, no hemoptysis, + asthma or wheezing  Musculoskeletal:  [x ] Arthritis, [ ]  Low back pain,  [x ] Joint pain  Hematologic:No history of hypercoagulable state.  No history of easy bleeding.  No history of anemia  Gastrointestinal: No hematochezia or melena,  No gastroesophageal reflux, no trouble swallowing  Urinary: [ ]  chronic Kidney disease, [ ]  on HD - [ ]  MWF or [ ]  TTHS, [ ]  Burning with urination, [ ]  Frequent urination, [ ]  Difficulty urinating;   Skin: No rashes  Psychological: No history of anxiety,  No history of depression   Physical Examination  Filed Vitals:   08/02/15 1324  BP: 138/92  Pulse: 88  Height: 5\' 4"  (1.626 m)  Weight: 283 lb (128.368 kg)  SpO2: 98%    Body mass index is 48.55 kg/(m^2).  General:  Alert and oriented, no acute distress HEENT: Normal Neck: No bruit or JVD Pulmonary: Clear to auscultation bilaterally Cardiac: Regular Rate and Rhythm without murmur Abdomen: Soft, non-tender, non-distended, no mass, obese Skin: No rash, no visible varicosities Extremity Pulses:  2+ radial, brachial, femoral, dorsalis pedis, posterior tibial pulses bilaterally Musculoskeletal: No deformity or edema  Neurologic: Upper and lower extremity motor 5/5 and symmetric  DATA: Patient had a venous duplex exam today in both lower extremities. This showed no evidence of DVT. She had no evidence of deep or superficial venous reflux. She had a normal exam bilaterally.   ASSESSMENT:  Intermittent leg swelling most likely secondary to venous hypertension from her obesity. I discussed with the patient today the possibility of weight loss reduction surgery. She states she has looked into this and is considering it. I also discussed with her about wearing lower extremity compression stockings for symptomatic relief.  PLAN:  Patient will continue to wear compression stockings for  symptomatically relieved 20-30 mm knee-high. She will follow-up with me on as-needed basis.  Ruta Hinds, MD [facility] Office: [phone] Pager: [phone]

## 2015-09-01 ENCOUNTER — Other Ambulatory Visit: Payer: Self-pay | Admitting: Family Medicine

## 2015-09-01 DIAGNOSIS — R1031 Right lower quadrant pain: Secondary | ICD-10-CM

## 2015-09-19 ENCOUNTER — Ambulatory Visit
Admission: RE | Admit: 2015-09-19 | Discharge: 2015-09-19 | Disposition: A | Discharge status: Home / Self Care | Payer: BLUE CROSS/BLUE SHIELD | Source: Ambulatory Visit | Attending: Family Medicine | Admitting: Family Medicine

## 2015-09-19 DIAGNOSIS — R1031 Right lower quadrant pain: Secondary | ICD-10-CM

## 2015-09-19 MED ORDER — IOPAMIDOL (ISOVUE-300) INJECTION 61%
125.0000 mL | Freq: Once | INTRAVENOUS | Status: AC | PRN
  Administered 2015-09-19: 125 mL via INTRAVENOUS

## 2015-09-21 ENCOUNTER — Other Ambulatory Visit: Payer: Self-pay | Admitting: Family Medicine

## 2015-09-21 DIAGNOSIS — R110 Nausea: Secondary | ICD-10-CM

## 2015-10-02 ENCOUNTER — Ambulatory Visit
Admission: RE | Admit: 2015-10-02 | Discharge: 2015-10-02 | Disposition: A | Discharge status: Home / Self Care | Payer: BLUE CROSS/BLUE SHIELD | Source: Ambulatory Visit | Attending: Family Medicine | Admitting: Family Medicine

## 2015-10-02 DIAGNOSIS — R110 Nausea: Secondary | ICD-10-CM

## 2015-11-23 ENCOUNTER — Other Ambulatory Visit: Payer: Self-pay | Admitting: Endocrinology

## 2016-02-29 ENCOUNTER — Encounter (HOSPITAL_BASED_OUTPATIENT_CLINIC_OR_DEPARTMENT_OTHER): Payer: Self-pay | Admitting: *Deleted

## 2016-03-01 NOTE — Progress Notes (Signed)
Pt seen by Dr Al Corpus for anesthesia pre op interview. [facility] for day surgery 03/04/16.

## 2016-03-04 ENCOUNTER — Encounter (HOSPITAL_BASED_OUTPATIENT_CLINIC_OR_DEPARTMENT_OTHER)
Admission: RE | Disposition: A | Discharge status: Home / Self Care | Payer: Self-pay | Source: Ambulatory Visit | Attending: Orthopedic Surgery

## 2016-03-04 ENCOUNTER — Ambulatory Visit (HOSPITAL_BASED_OUTPATIENT_CLINIC_OR_DEPARTMENT_OTHER): Payer: BLUE CROSS/BLUE SHIELD | Admitting: Anesthesiology

## 2016-03-04 ENCOUNTER — Encounter (HOSPITAL_BASED_OUTPATIENT_CLINIC_OR_DEPARTMENT_OTHER): Payer: Self-pay | Admitting: Anesthesiology

## 2016-03-04 ENCOUNTER — Ambulatory Visit (HOSPITAL_BASED_OUTPATIENT_CLINIC_OR_DEPARTMENT_OTHER)
Admission: RE | Admit: 2016-03-04 | Discharge: 2016-03-04 | Disposition: A | Discharge status: Home / Self Care | Payer: BLUE CROSS/BLUE SHIELD | Source: Ambulatory Visit | Attending: Orthopedic Surgery | Admitting: Orthopedic Surgery

## 2016-03-04 DIAGNOSIS — G5601 Carpal tunnel syndrome, right upper limb: Secondary | ICD-10-CM | POA: Insufficient documentation

## 2016-03-04 DIAGNOSIS — J45909 Unspecified asthma, uncomplicated: Secondary | ICD-10-CM | POA: Insufficient documentation

## 2016-03-04 DIAGNOSIS — Z6841 Body Mass Index (BMI) 40.0 and over, adult: Secondary | ICD-10-CM | POA: Insufficient documentation

## 2016-03-04 DIAGNOSIS — Z79899 Other long term (current) drug therapy: Secondary | ICD-10-CM | POA: Insufficient documentation

## 2016-03-04 DIAGNOSIS — E039 Hypothyroidism, unspecified: Secondary | ICD-10-CM | POA: Diagnosis not present

## 2016-03-04 DIAGNOSIS — E7800 Pure hypercholesterolemia, unspecified: Secondary | ICD-10-CM | POA: Insufficient documentation

## 2016-03-04 DIAGNOSIS — Z85850 Personal history of malignant neoplasm of thyroid: Secondary | ICD-10-CM | POA: Insufficient documentation

## 2016-03-04 HISTORY — PX: CARPAL TUNNEL RELEASE: SHX101

## 2016-03-04 HISTORY — DX: Unspecified asthma, uncomplicated: J45.909

## 2016-03-04 LAB — GLUCOSE, CAPILLARY: Glucose-Capillary: 98 mg/dL (ref 65–99)

## 2016-03-04 SURGERY — RELEASE, CARPAL TUNNEL, ENDOSCOPIC
Anesthesia: General | Site: Hand | Laterality: Right

## 2016-03-04 MED ORDER — DEXTROSE-NACL 5-0.45 % IV SOLN: 100.0000 mL/h | INTRAVENOUS | Status: DC

## 2016-03-04 MED ORDER — LIDOCAINE 2% (20 MG/ML) 5 ML SYRINGE
INTRAMUSCULAR | Status: DC | PRN
  Administered 2016-03-04: 60 mg via INTRAVENOUS

## 2016-03-04 MED ORDER — BUPIVACAINE HCL (PF) 0.25 % IJ SOLN
INTRAMUSCULAR | Status: AC
  Filled 2016-03-04: qty 30

## 2016-03-04 MED ORDER — PROPOFOL 10 MG/ML IV BOLUS
INTRAVENOUS | Status: AC
  Filled 2016-03-04: qty 20

## 2016-03-04 MED ORDER — DEXAMETHASONE SODIUM PHOSPHATE 4 MG/ML IJ SOLN
INTRAMUSCULAR | Status: DC | PRN
  Administered 2016-03-04: 10 mg via INTRAVENOUS

## 2016-03-04 MED ORDER — BUPIVACAINE HCL (PF) 0.5 % IJ SOLN
INTRAMUSCULAR | Status: AC
  Filled 2016-03-04: qty 30

## 2016-03-04 MED ORDER — MIDAZOLAM HCL 2 MG/2ML IJ SOLN: 1.0000 mg | INTRAMUSCULAR | Status: DC | PRN

## 2016-03-04 MED ORDER — PROPOFOL 500 MG/50ML IV EMUL
INTRAVENOUS | Status: AC
  Filled 2016-03-04: qty 50

## 2016-03-04 MED ORDER — ONDANSETRON HCL 4 MG/2ML IJ SOLN
INTRAMUSCULAR | Status: DC | PRN
  Administered 2016-03-04: 4 mg via INTRAVENOUS

## 2016-03-04 MED ORDER — HYDROMORPHONE HCL 1 MG/ML IJ SOLN: 0.2500 mg | INTRAMUSCULAR | Status: DC | PRN

## 2016-03-04 MED ORDER — FENTANYL CITRATE (PF) 100 MCG/2ML IJ SOLN
50.0000 ug | INTRAMUSCULAR | Status: DC | PRN
  Administered 2016-03-04: 100 ug via INTRAVENOUS

## 2016-03-04 MED ORDER — HYDROCODONE-ACETAMINOPHEN 5-325 MG PO TABS: 1.0000 | ORAL_TABLET | Freq: Four times a day (QID) | ORAL | Status: AC | PRN

## 2016-03-04 MED ORDER — BUPIVACAINE HCL (PF) 0.5 % IJ SOLN
INTRAMUSCULAR | Status: DC | PRN
  Administered 2016-03-04: 8 mL

## 2016-03-04 MED ORDER — PROPOFOL 10 MG/ML IV BOLUS
INTRAVENOUS | Status: DC | PRN
  Administered 2016-03-04: 200 mg via INTRAVENOUS

## 2016-03-04 MED ORDER — CHLORHEXIDINE GLUCONATE 4 % EX LIQD: 60.0000 mL | Freq: Once | CUTANEOUS | Status: DC

## 2016-03-04 MED ORDER — FENTANYL CITRATE (PF) 100 MCG/2ML IJ SOLN
INTRAMUSCULAR | Status: AC
  Filled 2016-03-04: qty 2

## 2016-03-04 MED ORDER — CEFAZOLIN SODIUM 1-5 GM-% IV SOLN
INTRAVENOUS | Status: AC
  Filled 2016-03-04: qty 50

## 2016-03-04 MED ORDER — CEFAZOLIN SODIUM-DEXTROSE 2-4 GM/100ML-% IV SOLN
INTRAVENOUS | Status: AC
  Filled 2016-03-04: qty 100

## 2016-03-04 MED ORDER — SCOPOLAMINE 1 MG/3DAYS TD PT72: 1.0000 | MEDICATED_PATCH | Freq: Once | TRANSDERMAL | Status: DC | PRN

## 2016-03-04 MED ORDER — ONDANSETRON HCL 4 MG/2ML IJ SOLN
INTRAMUSCULAR | Status: AC
  Filled 2016-03-04: qty 2

## 2016-03-04 MED ORDER — GLYCOPYRROLATE 0.2 MG/ML IJ SOLN: 0.2000 mg | Freq: Once | INTRAMUSCULAR | Status: DC | PRN

## 2016-03-04 MED ORDER — DEXTROSE 5 % IV SOLN
3.0000 g | INTRAVENOUS | Status: AC
  Administered 2016-03-04: 3 g via INTRAVENOUS

## 2016-03-04 MED ORDER — DEXAMETHASONE SODIUM PHOSPHATE 10 MG/ML IJ SOLN
INTRAMUSCULAR | Status: AC
  Filled 2016-03-04: qty 1

## 2016-03-04 MED ORDER — LACTATED RINGERS IV SOLN
INTRAVENOUS | Status: DC
  Administered 2016-03-04: 11:00:00 via INTRAVENOUS

## 2016-03-04 SURGICAL SUPPLY — 51 items
ASMB BLDE STD STRL DISP ECTR (BLADE) ×1
BANDAGE ACE 3X5.8 VEL STRL LF (GAUZE/BANDAGES/DRESSINGS) ×3 IMPLANT
BLADE SLIMLINE EXTR (BLADE) ×3 IMPLANT
BLADE SURG 15 STRL LF DISP TIS (BLADE) ×1 IMPLANT
BLADE SURG 15 STRL SS (BLADE) ×3
BNDG CMPR 9X4 STRL LF SNTH (GAUZE/BANDAGES/DRESSINGS) ×1
BNDG ESMARK 4X9 LF (GAUZE/BANDAGES/DRESSINGS) ×2 IMPLANT
CHLORAPREP W/TINT 26ML (MISCELLANEOUS) ×3 IMPLANT
CORDS BIPOLAR (ELECTRODE) IMPLANT
COVER BACK TABLE 60X90IN (DRAPES) ×3 IMPLANT
CUFF TOURNIQUET SINGLE 18IN (TOURNIQUET CUFF) IMPLANT
CUFF TOURNIQUET SINGLE 24IN (TOURNIQUET CUFF) ×2 IMPLANT
DRAPE EXTREMITY T 121X128X90 (DRAPE) ×3 IMPLANT
DRAPE IMP U-DRAPE 54X76 (DRAPES) ×4 IMPLANT
DRAPE SURG 17X23 STRL (DRAPES) ×3 IMPLANT
DRAPE U-SHAPE 47X51 STRL (DRAPES) ×2 IMPLANT
DRSG EMULSION OIL 3X3 NADH (GAUZE/BANDAGES/DRESSINGS) ×3 IMPLANT
DRSG TEGADERM 2-3/8X2-3/4 SM (GAUZE/BANDAGES/DRESSINGS) ×3 IMPLANT
GAUZE SPONGE 4X4 12PLY STRL (GAUZE/BANDAGES/DRESSINGS) ×3 IMPLANT
GLOVE BIO SURGEON STRL SZ7.5 (GLOVE) ×1 IMPLANT
GLOVE BIO SURGEON STRL SZ8 (GLOVE) IMPLANT
GLOVE BIOGEL PI IND STRL 8 (GLOVE) ×1 IMPLANT
GLOVE BIOGEL PI IND STRL 8.5 (GLOVE) IMPLANT
GLOVE BIOGEL PI INDICATOR 8 (GLOVE) ×4
GLOVE BIOGEL PI INDICATOR 8.5 (GLOVE)
GLOVE EXAM NITRILE EXT CUFF MD (GLOVE) ×2 IMPLANT
GLOVE SURG SS PI 6.5 STRL IVOR (GLOVE) ×2 IMPLANT
GLOVE SURG SS PI 7.5 STRL IVOR (GLOVE) ×4 IMPLANT
GOWN STRL REUS W/ TWL LRG LVL3 (GOWN DISPOSABLE) ×5 IMPLANT
GOWN STRL REUS W/ TWL XL LVL3 (GOWN DISPOSABLE) ×1 IMPLANT
GOWN STRL REUS W/TWL LRG LVL3 (GOWN DISPOSABLE) ×6
GOWN STRL REUS W/TWL XL LVL3 (GOWN DISPOSABLE) ×3
NDL HYPO 25X1 1.5 SAFETY (NEEDLE) IMPLANT
NEEDLE HYPO 25X1 1.5 SAFETY (NEEDLE) ×3 IMPLANT
NS IRRIG 1000ML POUR BTL (IV SOLUTION) ×3 IMPLANT
PACK BASIN DAY SURGERY FS (CUSTOM PROCEDURE TRAY) ×3 IMPLANT
PAD CAST 3X4 CTTN HI CHSV (CAST SUPPLIES) ×1 IMPLANT
PADDING CAST ABS 3INX4YD NS (CAST SUPPLIES)
PADDING CAST ABS 4INX4YD NS (CAST SUPPLIES)
PADDING CAST ABS COTTON 3X4 (CAST SUPPLIES) IMPLANT
PADDING CAST ABS COTTON 4X4 ST (CAST SUPPLIES) ×1 IMPLANT
PADDING CAST COTTON 3X4 STRL (CAST SUPPLIES) ×3
SOLUTION ANTI FOG 6CC (MISCELLANEOUS) IMPLANT
SPLINT PLASTER CAST XFAST 3X15 (CAST SUPPLIES) IMPLANT
SPLINT PLASTER XTRA FASTSET 3X (CAST SUPPLIES) ×20
SUT ETHILON 3 0 PS 1 (SUTURE) ×3 IMPLANT
SYR BULB 3OZ (MISCELLANEOUS) ×2 IMPLANT
SYR CONTROL 10ML LL (SYRINGE) ×3 IMPLANT
TOWEL OR 17X24 6PK STRL BLUE (TOWEL DISPOSABLE) ×3 IMPLANT
TOWEL OR NON WOVEN STRL DISP B (DISPOSABLE) ×1 IMPLANT
UNDERPAD 30X30 (UNDERPADS AND DIAPERS) ×3 IMPLANT

## 2016-03-04 NOTE — H&P (Signed)
ORTHOPAEDIC CONSULTATION  REQUESTING PHYSICIAN: Renette Butters, MD  Chief Complaint: R CTS  HPI: Miranda Dudley is a 55 y.o. female who complains of  R CTS  Past Medical History  Diagnosis Date  . HYPERLIPIDEMIA 08/19/2007  . HYPERCHOLESTEROLEMIA 05/04/2010  . Headache(784.0) 08/20/2007  . GOITER, MULTINODULAR 08/19/2007  . 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 ANXIETY 05/04/2010  . Migraine headache   . Asthma    Past Surgical History  Procedure Laterality Date  . Tubal ligation  2005  . Thyroidectomy  09/2003    left   . Laparoscopic assisted vaginal hysterectomy N/A 06/23/2014    Procedure: Attempted LAPAROSCOPIC ASSISTED VAGINAL HYSTERECTOMY AND BILATERAL SALPINGO OOPHORECTOMY;  Surgeon: Luz Lex, MD;  Location: [facility];  Service: Gynecology;  Laterality: N/A;  . Abdominal hysterectomy N/A 06/23/2014    Procedure: HYSTERECTOMY ABDOMINAL;  Surgeon: Luz Lex, MD;  Location: [facility];  Service: Gynecology;  Laterality: N/A;   Social History   Social History  . Marital Status: Married    Spouse Name: N/A  . Number of Children: N/A  . Years of Education: N/A   Occupational History  . Banking    Social History Main Topics  . Smoking status: Never Smoker   . Smokeless tobacco: Never Used  . Alcohol Use: Yes  . Drug Use: No  . Sexual Activity: Yes    Birth Control/ Protection: Surgical   Other Topics Concern  . None   Social History Narrative   Family History  Problem Relation Age of Onset  . Cancer Neg Hx    Allergies  Allergen Reactions  . Latex     Hives    Prior to Admission medications   Medication Sig Start Date End Date Taking? Authorizing Provider  albuterol (PROVENTIL HFA;VENTOLIN HFA) 108 (90 BASE) MCG/ACT inhaler Inhale into the lungs every 6 (six) hours as needed for wheezing or shortness of breath.   Yes Historical Provider, MD  ketoprofen (ORUDIS) 75 MG capsule  Take 75 mg by mouth every 8 (eight) hours as needed (headache).   Yes Historical Provider, MD  levothyroxine (SYNTHROID, LEVOTHROID) 75 MCG tablet TAKE 1 TABLET (75 MCG TOTAL) BY MOUTH DAILY BEFORE BREAKFAST. 11/23/15  Yes Renato Shin, MD  triamcinolone cream (KENALOG) 0.1 % Apply topically 3 (three) times daily. As needed for rash 02/03/15  Yes Renato Shin, MD  Cholecalciferol 1000 UNITS tablet Take 1,000 Units by mouth daily.    Historical Provider, MD  Multiple Vitamin (MULTIVITAMIN) tablet Take 1 tablet by mouth daily.    Historical Provider, MD   No results found.  Positive ROS: All other systems have been reviewed and were otherwise negative with the exception of those mentioned in the HPI and as above.  Labs cbc No results for input(s): WBC, HGB, HCT, PLT in the last 72 hours.  Labs inflam No results for input(s): CRP in the last 72 hours.  Invalid input(s): ESR  Labs coag No results for input(s): INR, PTT in the last 72 hours.  Invalid input(s): PT  No results for input(s): NA, K, CL, CO2, GLUCOSE, BUN, CREATININE, CALCIUM in the last 72 hours.  Physical Exam: Filed Vitals:   03/04/16 1053  BP: 144/89  Pulse: 78  Temp: 98.3 F (36.8 C)  Resp: 20   General: Alert, no acute distress Cardiovascular: No pedal edema Respiratory: No cyanosis, no use of accessory  musculature GI: No organomegaly, abdomen is soft and non-tender Skin: No lesions in the area of chief complaint other than those listed below in MSK exam.  Neurologic: Sensation intact distally save for the below mentioned MSK exam Psychiatric: Patient is competent for consent with normal mood and affect Lymphatic: No axillary or cervical lymphadenopathy  MUSCULOSKELETAL:  R median nerve symptoms Other extremities are atraumatic with painless ROM and NVI.  Assessment: R CTS  Plan: R CTR    Renette Butters, MD Cell [phone]   03/04/2016 12:43 PM

## 2016-03-04 NOTE — Transfer of Care (Signed)
Immediate Anesthesia Transfer of Care Note  Patient: Miranda Dudley  Procedure(s) Performed: Procedure(s): RIGHT CARPAL TUNNEL RELEASE ENDOSCOPIC (Right)  Patient Location: PACU  Anesthesia Type:General  Level of Consciousness: awake, sedated and patient cooperative  Airway & Oxygen Therapy: Patient Spontanous Breathing and Patient connected to face mask oxygen  Post-op Assessment: Report given to RN and Post -op Vital signs reviewed and stable  Post vital signs: Reviewed and stable  Last Vitals:  Filed Vitals:   03/04/16 1053  BP: 144/89  Pulse: 78  Temp: 36.8 C  Resp: 20    Last Pain: There were no vitals filed for this visit.       Complications: No apparent anesthesia complications

## 2016-03-04 NOTE — Discharge Instructions (Signed)
Wear splint for 3 days then remove.  You may shower over waterproof dressing - keep wound dry. If splint becomes too tight you may loosen bandage. Follow up as scheduled.  Call with questions or concerns.  Call your surgeon if you experience:   1.  Fever over 101.0. 2.  Inability to urinate. 3.  Nausea and/or vomiting. 4.  Extreme swelling or bruising at the surgical site. 5.  Continued bleeding from the incision. 6.  Increased pain, redness or drainage from the incision. 7.  Problems related to your pain medication. 8.  Any problems and/or concerns   Post Anesthesia Home Care Instructions  Activity: Get plenty of rest for the remainder of the day. A responsible adult should stay with you for 24 hours following the procedure.  For the next 24 hours, DO NOT: -Drive a car -Paediatric nurse -Drink alcoholic beverages -Take any medication unless instructed by your physician -Make any legal decisions or sign important papers.  Meals: Start with liquid foods such as gelatin or soup. Progress to regular foods as tolerated. Avoid greasy, spicy, heavy foods. If nausea and/or vomiting occur, drink only clear liquids until the nausea and/or vomiting subsides. Call your physician if vomiting continues.  Special Instructions/Symptoms: Your throat may feel dry or sore from the anesthesia or the breathing tube placed in your throat during surgery. If this causes discomfort, gargle with warm salt water. The discomfort should disappear within 24 hours.  If you had a scopolamine patch placed behind your ear for the management of post- operative nausea and/or vomiting:  1. The medication in the patch is effective for 72 hours, after which it should be removed.  Wrap patch in a tissue and discard in the trash. Wash hands thoroughly with soap and water. 2. You may remove the patch earlier than 72 hours if you experience unpleasant side effects which may include dry mouth, dizziness or visual  disturbances. 3. Avoid touching the patch. Wash your hands with soap and water after contact with the patch.

## 2016-03-04 NOTE — Anesthesia Postprocedure Evaluation (Signed)
Anesthesia Post Note  Patient: Miranda Dudley  Procedure(s) Performed: Procedure(s) (LRB): RIGHT CARPAL TUNNEL RELEASE ENDOSCOPIC (Right)  Patient location during evaluation: PACU Anesthesia Type: General Level of consciousness: awake and alert Pain management: pain level controlled Vital Signs Assessment: post-procedure vital signs reviewed and stable Respiratory status: spontaneous breathing, nonlabored ventilation and respiratory function stable Cardiovascular status: blood pressure returned to baseline and stable Postop Assessment: no signs of nausea or vomiting Anesthetic complications: no    Last Vitals:  Filed Vitals:   03/04/16 1415 03/04/16 1417  BP: 145/100 145/98  Pulse: 67 72  Temp:    Resp: 16 14    Last Pain:  Filed Vitals:   03/04/16 1419  PainSc: 0-No pain                 Hutson Luft,W. EDMOND

## 2016-03-04 NOTE — Op Note (Signed)
03/04/2016  1:31 PM  PATIENT:  Berna Bue Gammage    PRE-OPERATIVE DIAGNOSIS:  CARPAL TUNNEL SYNDROME RIGHT UPPER LIMB  POST-OPERATIVE DIAGNOSIS:  Same  PROCEDURE:  RIGHT CARPAL TUNNEL RELEASE ENDOSCOPIC  SURGEON:  Aadit Hagood, Ernesta Amble, MD  PHYSICIAN ASSISTANT: Roxan Hockey, PA-C, She was present and scrubbed throughout the case, critical for completion in a timely fashion, and for retraction, instrumentation, and closure.   ANESTHESIA:   General  PREOPERATIVE INDICATIONS:  CHASMINE NEEF is a  55 y.o. female with a diagnosis of [place] who failed conservative measures and elected for surgical management.    The risks benefits and alternatives were discussed with the patient preoperatively including but not limited to the risks of infection, bleeding, nerve injury, incomplete relief of symptoms, pillar pain, cardiopulmonary complications, the need for revision surgery, among others, and the patient was willing to proceed.  OPERATIVE FINDINGS: complete TCligament release  OPERATIVE PROCEDURE: Patient was identified in the preoperative holding area and site was marked by me. female was transported to the operating theater and placed on the table in the supine position taking care to pad all bony prominences. After appropriate time out general anesthesia was induced and female received ancef for preoperative antibiotics. The extremity was prepped and draped in normal sterile fashion.  I made a 1 severe incision just proximal to the dominant volar wrist crease in line with the radial aspect of the small finger. Spread down to the fascia and this was incised in line with the incision. Metzenbaum scissors were then prepped passed above and below the fascia proximally to clear the nerve away from the fascia clear soft tissue plane superficial to it as well. This was then incised.  I then turned attention distally where the fascia edge was as was visible within the  wound. I sequentially dilated the carpal tunnel sweeping soft tissue off of the undersurface of the ligament which was palpable throughout each past. I then inserted the endoscope and under direct visualization was able to see the undersurface the carpal tunnel throughout the entire insertion. Wasn't visualized the distal end of the ligament I put the blade retracted the instrument incising the transverse carpal ligament. I then reinserted the endoscope and directly visualized each cut and as well as intact median nerve without harm.   Wound was then irrigated and closed with a superficial suture. A sterile dressing and splint was applied   She tolerated this well, with no complications.  POSTOPERATIVE PLAN: Keep the splint clean and dry. VTE prophylaxis will consist of ambulation and foot pump exercises  This note was generated using a template and dragon dictation system. In light of that, I have reviewed the note and all aspects of it are applicable to this case. Any dictation errors are due to the computerized dictation system.

## 2016-03-04 NOTE — Anesthesia Procedure Notes (Signed)
Procedure Name: LMA Insertion Date/Time: 03/04/2016 1:07 PM Performed by: Lyndee Leo Pre-anesthesia Checklist: Patient identified, Emergency Drugs available, Suction available and Patient being monitored Patient Re-evaluated:Patient Re-evaluated prior to inductionOxygen Delivery Method: Circle System Utilized Preoxygenation: Pre-oxygenation with 100% oxygen Intubation Type: IV induction Ventilation: Mask ventilation without difficulty LMA: LMA inserted LMA Size: 4.0 Number of attempts: 1 Airway Equipment and Method: Bite block Placement Confirmation: positive ETCO2 Tube secured with: Tape Dental Injury: Teeth and Oropharynx as per pre-operative assessment

## 2016-03-04 NOTE — Interval H&P Note (Signed)
History and Physical Interval Note:  03/04/2016 12:43 PM  Miranda Dudley  has presented today for surgery, with the diagnosis of CARPAL TUNNEL SYNDROME RIGHT UPPER LIMB  The various methods of treatment have been discussed with the patient and family. After consideration of risks, benefits and other options for treatment, the patient has consented to  Procedure(s): RIGHT CARPAL TUNNEL RELEASE ENDOSCOPIC (Right) as a surgical intervention .  The patient's history has been reviewed, patient examined, no change in status, stable for surgery.  I have reviewed the patient's chart and labs.  Questions were answered to the patient's satisfaction.     Virgle Arth D

## 2016-03-04 NOTE — Anesthesia Preprocedure Evaluation (Addendum)
Anesthesia Evaluation  Patient identified by MRN, date of birth, ID band Patient awake    Reviewed: Allergy & Precautions, H&P , NPO status , Patient's Chart, lab work & pertinent test results  Airway Mallampati: II  TM Distance: >3 FB Neck ROM: Full    Dental no notable dental hx. (+) Teeth Intact, Dental Advisory Given   Pulmonary neg pulmonary ROS,    Pulmonary exam normal breath sounds clear to auscultation       Cardiovascular negative cardio ROS   Rhythm:Regular Rate:Normal     Neuro/Psych  Headaches, Anxiety negative psych ROS   GI/Hepatic negative GI ROS, Neg liver ROS,   Endo/Other  Hypothyroidism Morbid obesity  Renal/GU negative Renal ROS  negative genitourinary   Musculoskeletal   Abdominal   Peds  Hematology negative hematology ROS (+)   Anesthesia Other Findings   Reproductive/Obstetrics negative OB ROS                            Anesthesia Physical Anesthesia Plan  ASA: II  Anesthesia Plan: General   Post-op Pain Management:    Induction: Intravenous  Airway Management Planned: LMA  Additional Equipment:   Intra-op Plan:   Post-operative Plan: Extubation in OR  Informed Consent: I have reviewed the patients History and Physical, chart, labs and discussed the procedure including the risks, benefits and alternatives for the proposed anesthesia with the patient or authorized representative who has indicated his/her understanding and acceptance.   Dental advisory given  Plan Discussed with: CRNA  Anesthesia Plan Comments:        Anesthesia Quick Evaluation

## 2016-03-04 NOTE — Interval H&P Note (Signed)
History and Physical Interval Note:  03/04/2016 12:44 PM  Miranda Dudley  has presented today for surgery, with the diagnosis of [place]  The various methods of treatment have been discussed with the patient and family. After consideration of risks, benefits and other options for treatment, the patient has consented to  Procedure(s): RIGHT CARPAL TUNNEL RELEASE ENDOSCOPIC (Right) as a surgical intervention .  The patient's history has been reviewed, patient examined, no change in status, stable for surgery.  I have reviewed the patient's chart and labs.  Questions were answered to the patient's satisfaction.     Abrar Bilton D

## 2016-03-05 ENCOUNTER — Encounter (HOSPITAL_BASED_OUTPATIENT_CLINIC_OR_DEPARTMENT_OTHER): Payer: Self-pay | Admitting: Orthopedic Surgery

## 2016-04-25 ENCOUNTER — Other Ambulatory Visit: Payer: Self-pay | Admitting: Endocrinology

## 2016-05-13 ENCOUNTER — Other Ambulatory Visit: Payer: Self-pay | Admitting: Endocrinology

## 2016-05-13 NOTE — Telephone Encounter (Signed)
Please refill x 3 mos Ov is due 

## 2016-07-08 ENCOUNTER — Ambulatory Visit: Payer: BLUE CROSS/BLUE SHIELD | Admitting: Podiatry

## 2016-07-10 ENCOUNTER — Ambulatory Visit (INDEPENDENT_AMBULATORY_CARE_PROVIDER_SITE_OTHER): Payer: BLUE CROSS/BLUE SHIELD | Admitting: Podiatry

## 2016-07-10 ENCOUNTER — Encounter: Payer: Self-pay | Admitting: Podiatry

## 2016-07-10 ENCOUNTER — Ambulatory Visit (INDEPENDENT_AMBULATORY_CARE_PROVIDER_SITE_OTHER): Payer: BLUE CROSS/BLUE SHIELD

## 2016-07-10 VITALS — BP 120/73 | HR 81 | Resp 16 | Ht 64.0 in | Wt 286.0 lb

## 2016-07-10 DIAGNOSIS — M779 Enthesopathy, unspecified: Secondary | ICD-10-CM

## 2016-07-10 DIAGNOSIS — M21619 Bunion of unspecified foot: Secondary | ICD-10-CM

## 2016-07-10 DIAGNOSIS — M2041 Other hammer toe(s) (acquired), right foot: Secondary | ICD-10-CM | POA: Diagnosis not present

## 2016-07-10 DIAGNOSIS — L600 Ingrowing nail: Secondary | ICD-10-CM

## 2016-07-10 MED ORDER — TRIAMCINOLONE ACETONIDE 10 MG/ML IJ SUSP
10.0000 mg | Freq: Once | INTRAMUSCULAR | Status: AC
  Administered 2016-07-10: 10 mg

## 2016-07-10 NOTE — Patient Instructions (Signed)

## 2016-07-10 NOTE — Progress Notes (Signed)
   Subjective:    Patient ID: Miranda Dudley, female    DOB: 1961/05/03, 55 y.o.   MRN: [id number]  HPI  Chief Complaint  Patient presents with  . Nail Problem    R great nail sorness x 3 wks.  Pt states "it's not an ingrown"  Can't stand for sheets to touch it.          Review of Systems  All other systems reviewed and are negative.      Objective:   Physical Exam        Assessment & Plan:

## 2016-07-11 NOTE — Progress Notes (Signed)
Subjective:     Patient ID: Miranda Dudley, female   DOB: October 29, 1960, 55 y.o.   MRN: [id number]  HPI patient presents stating she has pain in her right hallux nail on them inside and also is developing digital deformities right over left foot with structural bunion deformities   Review of Systems  All other systems reviewed and are negative.      Objective:   Physical Exam  Constitutional: She is oriented to person, place, and time.  Cardiovascular: Intact distal pulses.   Musculoskeletal: Normal range of motion.  Neurological: She is oriented to person, place, and time.  Skin: Skin is warm.  Nursing note and vitals reviewed.  neurovascular status intact muscle strength adequate range of motion within normal limits with patient noted to have moderate forefoot structural instability with hyperostosis around the medial aspect of the first metatarsal head deviation of the big toe and lifting of the second toes. Patient is noted to have thickened damage right hallux nail that's very tender on the medial side with changes also on the dorsal surface but not to the same degree of intensity. Patient's found have good digital perfusion and is well oriented 3     Assessment:     Ingrown toenail deformity right hallux medial border along with hammertoe deformity and structural bunion deformity    Plan:     H&P and all conditions reviewed. At this point I recommended removal of the nail corner explaining that eventually the entire nail may need to be removed and I explained risk and she wants procedure. I infiltrated the right hallux 60 mg like Marcaine mixture reviewed and remove the medial border and exposed matrix applying phenol 3 applications 30 seconds followed by alcohol lavage area I applied sterile dressing explained soaks and ready reappoint and we also reviewed her x-rays concerning bunions and hammertoe in this may need to be corrected at one point in the future but we will try cushion  pads at this time  X-ray report indicated that there is mild to moderate structural bunion deformity with lifting of the digit right over left

## 2016-07-31 ENCOUNTER — Ambulatory Visit (INDEPENDENT_AMBULATORY_CARE_PROVIDER_SITE_OTHER): Payer: BLUE CROSS/BLUE SHIELD | Admitting: Podiatry

## 2016-07-31 ENCOUNTER — Encounter: Payer: Self-pay | Admitting: Podiatry

## 2016-07-31 DIAGNOSIS — M21619 Bunion of unspecified foot: Secondary | ICD-10-CM | POA: Diagnosis not present

## 2016-07-31 DIAGNOSIS — M779 Enthesopathy, unspecified: Secondary | ICD-10-CM | POA: Diagnosis not present

## 2016-07-31 DIAGNOSIS — L600 Ingrowing nail: Secondary | ICD-10-CM

## 2016-07-31 NOTE — Progress Notes (Signed)
Subjective:     Patient ID: Miranda Dudley, female   DOB: 08-09-1961, 55 y.o.   MRN: [id number]  HPI patient states that she was just concerned about crust on her big toe and also was still concerned about her structural bunion deformity   Review of Systems     Objective:   Physical Exam Neurovascular status intact with patient noted to have crust in the right hallux medial side localized in nature with structural bunion right over left and elevation of the second digit bilateral    Assessment:     Ingrown toenail healing normally of the right big toe with digital deformity and structural bunion deformity    Plan:     H&P conditions reviewed and cleaned out the border of the right hallux. Do not recommend any further treatment and discussed bunion correction and digital correction which we'll need to be performed at one point in future

## 2016-08-26 ENCOUNTER — Other Ambulatory Visit: Payer: Self-pay | Admitting: Endocrinology

## 2016-08-26 NOTE — Telephone Encounter (Signed)
Please refill x 1 Ov is due  

## 2016-09-29 ENCOUNTER — Other Ambulatory Visit: Payer: Self-pay | Admitting: Endocrinology

## 2016-09-30 NOTE — Telephone Encounter (Signed)
Please refill x 2 mos Ov is due 

## 2017-01-13 ENCOUNTER — Other Ambulatory Visit: Payer: Self-pay | Admitting: Endocrinology

## 2017-02-19 ENCOUNTER — Ambulatory Visit (INDEPENDENT_AMBULATORY_CARE_PROVIDER_SITE_OTHER): Payer: BLUE CROSS/BLUE SHIELD | Admitting: Podiatry

## 2017-02-19 ENCOUNTER — Ambulatory Visit (INDEPENDENT_AMBULATORY_CARE_PROVIDER_SITE_OTHER): Payer: BLUE CROSS/BLUE SHIELD

## 2017-02-19 DIAGNOSIS — S99911A Unspecified injury of right ankle, initial encounter: Secondary | ICD-10-CM | POA: Diagnosis not present

## 2017-02-19 DIAGNOSIS — M7661 Achilles tendinitis, right leg: Secondary | ICD-10-CM | POA: Diagnosis not present

## 2017-02-19 MED ORDER — DICLOFENAC SODIUM 75 MG PO TBEC: 75.0000 mg | DELAYED_RELEASE_TABLET | Freq: Two times a day (BID) | ORAL | 2 refills | Status: AC

## 2017-02-19 NOTE — Progress Notes (Signed)
Subjective:    Patient ID: Miranda Dudley, female   DOB: 56 y.o.   MRN: [id number]   HPI patient stated that she strained the Achilles tendon that she is worried that ruptured and she cannot walk on it and she is obese which is complicating factor for this condition    ROS      Objective:  Physical Exam Neurovascular status intact with patient's right posterior heel showing inflammation of the tendon but I did not see signs currently of or rupture but it is very tender when pressed    Assessment:   Severe acute Achilles tendinitis right exam spray to bite any form of activity with no indication currently of tear      Plan:    H&P condition reviewed. At this point I applied air fracture walker to completely immobilize and discussed aggressive ice therapy to the area and will see back again in 3 weeks

## 2017-03-12 ENCOUNTER — Ambulatory Visit (INDEPENDENT_AMBULATORY_CARE_PROVIDER_SITE_OTHER): Payer: BLUE CROSS/BLUE SHIELD | Admitting: Podiatry

## 2017-03-12 DIAGNOSIS — M7661 Achilles tendinitis, right leg: Secondary | ICD-10-CM | POA: Diagnosis not present

## 2017-03-12 MED ORDER — TRIAMCINOLONE ACETONIDE 10 MG/ML IJ SUSP
10.0000 mg | Freq: Once | INTRAMUSCULAR | Status: AC
  Administered 2017-03-12: 10 mg

## 2017-03-12 NOTE — Progress Notes (Signed)
Subjective:    Patient ID: Miranda Dudley, female   DOB: 56 y.o.   MRN: [id number]   HPI patient states that she's had mild improvement in her right Achilles but it seems like it's more on the inside now and she has been wearing her boot    ROS      Objective:  Physical Exam Neurovascular status intact negative Homans sign noted with pain it's mostly in the medial side of the right Achilles near the insertion into the radius. The center and the more proximal portion seems to have resolved with utilization of immobilization    Assessment:    Distal medial Achilles tendinitis right with the remainder under better control     Plan:    Educated patient on this condition and recommended very careful injection of the medial side explaining the chances for rupture associated with it. They're willing to accept risk and today I went ahead and did a sterile prep of the right medial ankle and injected medial to the Achilles tendon with 3 mg Dexon some Kenalog 5 mill grams Xylocaine advised on continued boot usage ice therapy and reduced activity. Reappoint 3 weeks and may require physical therapy

## 2017-04-02 ENCOUNTER — Encounter: Payer: Self-pay | Admitting: Podiatry

## 2017-04-02 ENCOUNTER — Ambulatory Visit (INDEPENDENT_AMBULATORY_CARE_PROVIDER_SITE_OTHER): Payer: BLUE CROSS/BLUE SHIELD | Admitting: Podiatry

## 2017-04-02 DIAGNOSIS — M7661 Achilles tendinitis, right leg: Secondary | ICD-10-CM

## 2017-04-02 NOTE — Progress Notes (Signed)
Subjective:    Patient ID: Miranda Dudley, female   DOB: 56 y.o.   MRN: [id number]   HPI patient states that her Achilles seems to be doing much better    ROS      Objective:  Physical Exam neurovascular status intact with medial side of the right Achilles improved with discomfort upon deep palpation but much better than previously     Assessment:   Appears to be doing much better with Achilles tendinitis right      Plan:    Advised on the decision of physical therapy anti-inflammatories shoes with heels present. Patient will be seen back as needed

## 2017-04-22 ENCOUNTER — Other Ambulatory Visit: Payer: Self-pay | Admitting: Endocrinology

## 2017-06-19 ENCOUNTER — Other Ambulatory Visit: Payer: Self-pay | Admitting: Sports Medicine

## 2017-06-19 DIAGNOSIS — M5412 Radiculopathy, cervical region: Secondary | ICD-10-CM

## 2017-06-23 ENCOUNTER — Other Ambulatory Visit: Payer: BLUE CROSS/BLUE SHIELD

## 2017-07-22 ENCOUNTER — Ambulatory Visit
Admission: RE | Admit: 2017-07-22 | Discharge: 2017-07-22 | Disposition: A | Discharge status: Home / Self Care | Payer: Self-pay | Source: Ambulatory Visit | Attending: Sports Medicine | Admitting: Sports Medicine

## 2017-07-22 DIAGNOSIS — M5412 Radiculopathy, cervical region: Secondary | ICD-10-CM

## 2017-08-18 DIAGNOSIS — Z0279 Encounter for issue of other medical certificate: Secondary | ICD-10-CM

## 2017-08-22 ENCOUNTER — Other Ambulatory Visit (HOSPITAL_COMMUNITY): Payer: Self-pay | Admitting: General Surgery

## 2017-08-27 ENCOUNTER — Ambulatory Visit
Admission: RE | Admit: 2017-08-27 | Discharge: 2017-08-27 | Disposition: A | Discharge status: Home / Self Care | Payer: BLUE CROSS/BLUE SHIELD | Source: Ambulatory Visit | Attending: Sports Medicine | Admitting: Sports Medicine

## 2017-08-27 ENCOUNTER — Other Ambulatory Visit: Payer: Self-pay | Admitting: Sports Medicine

## 2017-08-27 ENCOUNTER — Inpatient Hospital Stay
Admission: RE | Admit: 2017-08-27 | Discharge: 2017-08-27 | Disposition: A | Discharge status: Home / Self Care | Payer: Self-pay | Source: Ambulatory Visit | Attending: Sports Medicine | Admitting: Sports Medicine

## 2017-08-27 DIAGNOSIS — M5412 Radiculopathy, cervical region: Secondary | ICD-10-CM

## 2017-09-01 ENCOUNTER — Ambulatory Visit: Payer: BLUE CROSS/BLUE SHIELD

## 2017-09-01 ENCOUNTER — Ambulatory Visit (INDEPENDENT_AMBULATORY_CARE_PROVIDER_SITE_OTHER): Payer: BLUE CROSS/BLUE SHIELD

## 2017-09-01 ENCOUNTER — Encounter: Payer: Self-pay | Admitting: Podiatry

## 2017-09-01 ENCOUNTER — Other Ambulatory Visit: Payer: Self-pay | Admitting: Podiatry

## 2017-09-01 ENCOUNTER — Ambulatory Visit: Payer: BLUE CROSS/BLUE SHIELD | Admitting: Podiatry

## 2017-09-01 DIAGNOSIS — M779 Enthesopathy, unspecified: Secondary | ICD-10-CM

## 2017-09-01 DIAGNOSIS — M7661 Achilles tendinitis, right leg: Secondary | ICD-10-CM

## 2017-09-01 NOTE — Progress Notes (Signed)
Subjective:    Patient ID: Miranda Dudley, female   DOB: 56 y.o.   MRN: [id number]   HPI patient presents stating I'm getting a lot of pain back in my right Achilles and it's been around a month. I been taking the anti-inflammatories sporadically and it's making it hard to walk.    ROS      Objective:  Physical Exam neurovascular status found to be intact muscle strength was adequate range of motion within normal limits with patient found to have right posterior heel pain in both the medial lateral side with inflammation fluid around this area. Patient is found to have moderate equinus condition     Assessment:    Reoccurrence of acute Achilles tendinitis right     Plan:   At this point I advised her on condition I advised on anti-inflammatories and we discussed the utilization of shockwave to try to get this condition better. I've recommended working on both the medial and lateral sides along with the calf muscle to stretch it and she will begin this treatment with the technician next week. Was given all complications associated with it and I advised her on wearing her boot this week and utilizing ice to reduce inflammation  X-ray indicates is been slight increase in size of the bone spur formation right posterior heel but still in a moderate range

## 2017-09-10 ENCOUNTER — Telehealth: Payer: Self-pay | Admitting: *Deleted

## 2017-09-10 NOTE — Telephone Encounter (Signed)
Received Fax of pt's [id number] complete metabolic panel, and gave to DR. Regal with [facility].

## 2017-09-16 ENCOUNTER — Ambulatory Visit: Payer: BLUE CROSS/BLUE SHIELD

## 2017-09-17 ENCOUNTER — Ambulatory Visit: Payer: Self-pay | Admitting: Registered"

## 2017-09-24 ENCOUNTER — Ambulatory Visit (HOSPITAL_COMMUNITY): Payer: BLUE CROSS/BLUE SHIELD

## 2017-09-24 ENCOUNTER — Other Ambulatory Visit (HOSPITAL_COMMUNITY): Payer: BLUE CROSS/BLUE SHIELD

## 2017-10-02 ENCOUNTER — Ambulatory Visit (HOSPITAL_COMMUNITY)
Admission: RE | Admit: 2017-10-02 | Discharge: 2017-10-02 | Disposition: A | Discharge status: Home / Self Care | Payer: BLUE CROSS/BLUE SHIELD | Source: Ambulatory Visit | Attending: General Surgery | Admitting: General Surgery

## 2017-10-02 ENCOUNTER — Other Ambulatory Visit: Payer: Self-pay

## 2017-10-02 DIAGNOSIS — Z01810 Encounter for preprocedural cardiovascular examination: Secondary | ICD-10-CM | POA: Insufficient documentation

## 2017-10-02 DIAGNOSIS — R9431 Abnormal electrocardiogram [ECG] [EKG]: Secondary | ICD-10-CM | POA: Diagnosis not present

## 2017-10-02 DIAGNOSIS — K449 Diaphragmatic hernia without obstruction or gangrene: Secondary | ICD-10-CM | POA: Insufficient documentation

## 2017-10-02 DIAGNOSIS — Z01812 Encounter for preprocedural laboratory examination: Secondary | ICD-10-CM | POA: Diagnosis not present

## 2017-10-02 DIAGNOSIS — K219 Gastro-esophageal reflux disease without esophagitis: Secondary | ICD-10-CM | POA: Diagnosis not present

## 2017-10-22 ENCOUNTER — Ambulatory Visit: Payer: Self-pay | Admitting: Registered"

## 2017-11-05 ENCOUNTER — Encounter: Payer: Self-pay | Admitting: Registered"

## 2017-11-05 ENCOUNTER — Encounter: Payer: BLUE CROSS/BLUE SHIELD | Attending: General Surgery | Admitting: Registered"

## 2017-11-05 DIAGNOSIS — Z713 Dietary counseling and surveillance: Secondary | ICD-10-CM | POA: Insufficient documentation

## 2017-11-05 DIAGNOSIS — R7303 Prediabetes: Secondary | ICD-10-CM | POA: Diagnosis not present

## 2017-11-05 DIAGNOSIS — E669 Obesity, unspecified: Secondary | ICD-10-CM

## 2017-11-05 NOTE — Progress Notes (Signed)
Pre-Op Assessment Visit:  Pre-Operative Sleeve Gastrectomy Surgery  Medical Nutrition Therapy:  Appt start time: 2:45  End time:  3:45  Patient was seen on 11/05/2017 for Pre-Operative Nutrition Assessment. Assessment and letter of approval faxed to [facility] Surgery Bariatric Surgery Program coordinator on 11/05/2017.   Pt expectation of surgery: healthier lifestyle, reduce medications, improve joint pain  Pt expectation of Dietitian: help with managing eating to prevent challenges post-surgery  Start weight at [facility]: 284.8 BMI: 50.45   Co-morbidities: prediabetes, cervical degenerative disc disease, hypertension Pt states she struggles to exercise due to joint issues, doesn't have access to evening water aerobics classes. Pt states she is hoping to lose weight even without surgery. Pt states she has issues with thyroid; causes to gain weight. Pt reports achilles tendon pain. Pt states she loves to play tennis with husband and hopes to be able to do that soon after weight loss.   Per insurance, pt needs 6 SWL visits prior to surgery.    24 hr Dietary Recall: First Meal: sometimes skips; [employer]-sausage,egg, bacon McMuffin, caramel mocha or instant oatmeal with peanut butter Snack: none Second Meal: sometimes skips; gyro sometimes with fries Snack: none Third Meal: chicken/salmon/steak/turkey, rice, sometimes vegetables in rice Snack: none Beverages: water, coffee (1 cup)  Encouraged to engage in 150 minutes of moderate physical activity including cardiovascular and weight baring weekly  Handouts given during visit include:  . Pre-Op Goals . Bariatric Surgery Protein Shakes . Vitamin and Mineral Recommendations  During the appointment today the following Pre-Op Goals were reviewed with the patient: . Track your food and beverage: MyFitness Pal or Baritastic App . Make healthy food choices . Begin to limit portion sizes . Limited concentrated sugars and fried  foods . Keep fat/sugar in the single digits per serving on          food labels . Practice CHEWING your food  (aim for 30 chews per bite or until applesauce consistency) . Practice not drinking 15 minutes before, during, and 30 minutes after each meal/snack . Avoid all carbonated beverages  . Avoid/limit caffeinated beverages  . Avoid all sugar-sweetened beverages . Avoid alcohol . Consume 3 meals per day; eat every 3-5 hours . Make a list of non-food related activities . Aim for 64-100 ounces of FLUID daily  . Aim for at least 60-80 grams of PROTEIN daily . Look for a liquid protein source that contain ?15 g protein and ?5 g carbohydrate  (ex: shakes, drinks, shots) . Physical activity is an important part of a healthy lifestyle so keep it moving!  Follow diet recommendations listed below Energy and Macronutrient Recommendations: Calories: 1600 Carbohydrate: 180 Protein: 120 Fat: 44  Demonstrated degree of understanding via:  Teach Back   Teaching Method Utilized:  Visual Auditory Hands on  Barriers to learning/adherence to lifestyle change: none  Patient to call the Nutrition [facility] Education Services to enroll in Pre-Op and Post-Op Nutrition Education when surgery date is scheduled.

## 2017-12-04 ENCOUNTER — Ambulatory Visit: Payer: BLUE CROSS/BLUE SHIELD | Admitting: Registered"

## 2017-12-11 ENCOUNTER — Encounter: Payer: BLUE CROSS/BLUE SHIELD | Attending: General Surgery | Admitting: Registered"

## 2017-12-11 ENCOUNTER — Encounter: Payer: Self-pay | Admitting: Registered"

## 2017-12-11 DIAGNOSIS — Z713 Dietary counseling and surveillance: Secondary | ICD-10-CM | POA: Insufficient documentation

## 2017-12-11 DIAGNOSIS — E669 Obesity, unspecified: Secondary | ICD-10-CM

## 2017-12-11 DIAGNOSIS — R7303 Prediabetes: Secondary | ICD-10-CM | POA: Diagnosis not present

## 2017-12-11 NOTE — Progress Notes (Signed)
Sleeve Gastrectomy Appt start time: 3:52 end time: 4:10  Assessment: 1st SWL Appointment.   Start Wt at [facility]: 284.8 Wt: 278.3 BMI: 49.30   Pt arrives having lost 6.5 lbs from previous visit.  Pt states she has been reading labels to keep fat and sugar in single digits per serving; eating less carbohydrates.  Pt states she is drinking at least 64 ounces of fluid a day. Pt states her joints are feeling better since increasing her physical fitness. Pt states she has eliminated coffee from routine. Pt states she helps take care of her mom some days after work; mom is having surgery next week.   MEDICATIONS: See list   DIETARY INTAKE:  24-hr recall:  B ( AM): protein shake or eggs with mushrooms/ Snk ( AM): sometimes protein shake  L ( PM): salad with steak or chicken Snk ( PM): none D ( PM): cauliflower rice with garlic rice, stir fry peas and carrots, chicken/salmon Snk ( PM): none Beverages: water, green tea, chai  Usual physical activity: walk away the pounds video 25-30 min, 2x/week   Diet to Follow: 1600 calories 180 g carbohydrates 120 g protein 44 g fat  Preferred Learning Style:   No preference indicated   Learning Readiness:   Ready  Change in progress     Nutritional Diagnosis:  [place]-3.3 Overweight/obesity related to past poor dietary habits and physical inactivity as evidenced by patient w/ planned sleeve gastrectomy surgery following dietary guidelines for continued weight loss.    Intervention:  Nutrition counseling for upcoming Bariatric Surgery.  Goals:  - Aim for 150 minutes of physical activity including cardio and weight bearing every week - Continue to work on chewing at least 30 times per bite.  - Set alarm to have snack between breakfast and lunch. Aim for nutrient-dense snacks such as fresh vegetables, nuts, cheese, etc.   Teaching Method Utilized:  Visual Auditory Hands on  Handouts given during visit include:  none  Barriers to  learning/adherence to lifestyle change: none identified  Demonstrated degree of understanding via:  Teach Back   Monitoring/Evaluation:  Dietary intake, exercise, and body weight in 1 month(s).

## 2017-12-11 NOTE — Patient Instructions (Addendum)
-   Continue to work on chewing at least 30 times per bite.   - Set alarm to have snack between breakfast and lunch. Aim for nutrient-dense snacks such as fresh vegetables, nuts, cheese, etc.

## 2018-01-05 ENCOUNTER — Ambulatory Visit: Payer: BLUE CROSS/BLUE SHIELD | Admitting: Psychology

## 2018-01-07 ENCOUNTER — Ambulatory Visit: Payer: BLUE CROSS/BLUE SHIELD | Admitting: Registered"

## 2018-01-08 ENCOUNTER — Encounter: Payer: Self-pay | Admitting: Registered"

## 2018-01-08 ENCOUNTER — Encounter: Payer: BLUE CROSS/BLUE SHIELD | Attending: General Surgery | Admitting: Registered"

## 2018-01-08 DIAGNOSIS — E669 Obesity, unspecified: Secondary | ICD-10-CM

## 2018-01-08 DIAGNOSIS — Z713 Dietary counseling and surveillance: Secondary | ICD-10-CM | POA: Diagnosis not present

## 2018-01-08 DIAGNOSIS — R7303 Prediabetes: Secondary | ICD-10-CM | POA: Insufficient documentation

## 2018-01-08 NOTE — Patient Instructions (Addendum)
-   Continue to work on chewing well. Put fork down between bites.   - Create physical activity routine by getting up at 5:30am for 20-30 min, 3 days/week: stretching, walking, etc.  - Set alarm to have snack between breakfast and lunch. Aim for nutrient-dense snacks such as fresh vegetables, nuts, cheese, etc. Leave snacks at work.   - Keep up the great work!

## 2018-01-08 NOTE — Progress Notes (Signed)
Sleeve Gastrectomy Appt start time: 3:35 end time: 3:54  Assessment: 2nd SWL Appointment.   Start Wt at [facility]: 284.8 Wt: 273.3 BMI: 48.41   Pt arrives having lost 5 lbs from previous visit.  Pt states she has not been working out as much as she wants; tries to walk. Pt states she is still working on chewing well, does well some days, forgets on other days. Pt states she prefers to walk in the morning. Pt reports noticing less joint pain now than before. Pt states she changed milk from Lactaid 2% milk to almond milk. Pt states she is doing more cauliflower rice and less carbohydrates.   Pt states she has been reading labels to keep fat and sugar in single digits per serving; eating less carbohydrates. Pt states she is drinking at least 64 ounces of fluid a day. Pt states she has eliminated coffee from routine. Pt states she helps take care of her mom some days after work; mom's surgery went well.   Per insurance, pt needs 6 SWL visits prior to surgery.  MEDICATIONS: See list   DIETARY INTAKE:  24-hr recall:  B ( AM): protein shake or eggs with mushrooms/ Snk ( AM): sometimes protein shake  L ( PM): salad with steak or chicken Snk ( PM): none D ( PM): cauliflower rice with garlic rice, stir fry peas and carrots, chicken/salmon Snk ( PM): none Beverages: water, green tea, chai  Usual physical activity: walk away the pounds video 25-30 min, 2x/week   Diet to Follow: 1600 calories 180 g carbohydrates 120 g protein 44 g fat  Preferred Learning Style:   No preference indicated   Learning Readiness:   Ready  Change in progress     Nutritional Diagnosis:  [place]-3.3 Overweight/obesity related to past poor dietary habits and physical inactivity as evidenced by patient w/ planned sleeve gastrectomy surgery following dietary guidelines for continued weight loss.    Intervention:  Nutrition counseling for upcoming Bariatric Surgery.  Goals:  - Continue to work on chewing well.  Put fork down between bites.  - Create physical activity routine by getting up at 5:30am for 20-30 min, 3 days/week: stretching, walking, etc. - Set alarm to have snack between breakfast and lunch. Aim for nutrient-dense snacks such as fresh vegetables, nuts, cheese, etc. Leave snacks at work.  - Keep up the great work!    Teaching Method Utilized:  Visual Auditory Hands on  Handouts given during visit include:  none  Barriers to learning/adherence to lifestyle change: none identified  Demonstrated degree of understanding via:  Teach Back   Monitoring/Evaluation:  Dietary intake, exercise, and body weight in 1 month(s).

## 2018-02-05 ENCOUNTER — Encounter: Payer: BLUE CROSS/BLUE SHIELD | Attending: General Surgery | Admitting: Registered"

## 2018-02-05 ENCOUNTER — Encounter: Payer: Self-pay | Admitting: Registered"

## 2018-02-05 DIAGNOSIS — Z713 Dietary counseling and surveillance: Secondary | ICD-10-CM | POA: Diagnosis present

## 2018-02-05 DIAGNOSIS — E669 Obesity, unspecified: Secondary | ICD-10-CM

## 2018-02-05 DIAGNOSIS — R7303 Prediabetes: Secondary | ICD-10-CM | POA: Insufficient documentation

## 2018-02-05 NOTE — Progress Notes (Signed)
Sleeve Gastrectomy Appt start time: 3:40 end time: 3:56  Assessment: 3rd SWL Appointment.   Start Wt at [facility]: 284.8 Wt: 271.3 BMI: 48.06   Pt arrives having lost about 2 lbs from previous visit.  Pt states she has included some carbohydrates recently. Pt states she has been using the stairs more and attended the gym once. Pt states she is making an extra effort to walk more. Pt states she is still working on chewing well, does well some days, forgets on other days. Pt states she tries not to eat after 7-8 pm. Pt states she is not a snacker.   Pt states she prefers to walk in the morning. Pt reports noticing less joint pain now than before. Pt states she changed milk from Lactaid 2% milk to almond milk.   Pt states she has been reading labels to keep fat and sugar in single digits per serving. Pt states she is drinking at least 64 ounces of fluid a day. Pt states she has eliminated coffee from routine. Pt states she helps take care of her mom some days after work; mom's surgery went well.   Per insurance, pt needs 6 SWL visits prior to surgery.  MEDICATIONS: See list   DIETARY INTAKE:  24-hr recall:  B ( AM): protein shake or eggs with mushrooms Snk ( AM): sometimes protein shake  L ( PM): salad with steak or chicken Snk ( PM): none D ( PM): cauliflower rice with garlic rice, stir fry peas and carrots, chicken/salmon Snk ( PM): none Beverages: water, green tea, chai  Usual physical activity: walk away the pounds video 25-30 min, 2x/week   Diet to Follow: 1600 calories 180 g carbohydrates 120 g protein 44 g fat  Preferred Learning Style:   No preference indicated   Learning Readiness:   Ready  Change in progress     Nutritional Diagnosis:  [place]-3.3 Overweight/obesity related to past poor dietary habits and physical inactivity as evidenced by patient w/ planned sleeve gastrectomy surgery following dietary guidelines for continued weight loss.    Intervention:   Nutrition counseling for upcoming Bariatric Surgery.  Goals:  - Aim to walk on lunch break at least 30 minutes, 4 days/week.  - Continue to work on chewing well.  - Aim to not drink 15 minutes before eating, not while eating, and waiting 30 minutes after eating to drink.  - You are doing great!   Teaching Method Utilized:  Visual Auditory Hands on  Handouts given during visit include:  none  Barriers to learning/adherence to lifestyle change: none identified  Demonstrated degree of understanding via:  Teach Back   Monitoring/Evaluation:  Dietary intake, exercise, and body weight in 1 month(s).

## 2018-02-05 NOTE — Patient Instructions (Addendum)
-   Aim to walk on lunch break at least 30 minutes, 4 days/week.   - Continue to work on chewing well.   - Aim to not drink 15 minutes before eating, not while eating, and waiting 30 minutes after eating to drink.   - You are doing great!

## 2018-03-04 ENCOUNTER — Encounter: Payer: BLUE CROSS/BLUE SHIELD | Attending: General Surgery | Admitting: Registered"

## 2018-03-04 ENCOUNTER — Encounter: Payer: Self-pay | Admitting: Registered"

## 2018-03-04 DIAGNOSIS — Z713 Dietary counseling and surveillance: Secondary | ICD-10-CM | POA: Diagnosis present

## 2018-03-04 DIAGNOSIS — R7303 Prediabetes: Secondary | ICD-10-CM | POA: Insufficient documentation

## 2018-03-04 DIAGNOSIS — E669 Obesity, unspecified: Secondary | ICD-10-CM

## 2018-03-04 NOTE — Patient Instructions (Addendum)
-   Aim to eat breakfast at home before leaving to go to work.   - Continue to work on chewing well.   - Be more conscious of not drinking 15 minutes before eating, not while eating, and waiting 30 minutes after eating to drink.

## 2018-03-04 NOTE — Progress Notes (Signed)
Sleeve Gastrectomy Appt start time: 4:35 end time: 4:52  Assessment: 4th SWL Appointment.   Start Wt at [facility]: 284.8 Wt: 271.0 BMI: 48.06   Pt arrives having maintained weight from previous visit.  Pt states she has been able to walk more and her joints feel better; overall she feels better. Pt states she would prefer to get up early and walk because she likes being out in the "morning air". Pt states she sleeps 11pm-5:30 (6.5 hrs) nightly. Pt states it is easy for her to not drink around meals; doing well with this goal. Pt states she does well with chewing when she is not rushing. Pt reports eating breakfast in the car on the way to work or once she gets to work (which is rushed). Pt states she is going on vacation to the [place] for 2 weeks in June/July.    Pt states she has included some carbohydrates recently. Pt states she has been using the stairs more. Pt states she is still working on chewing well, does well some days, forgets on other days. Pt states she tries not to eat after 7-8 pm. Pt states she is not a snacker.   Pt reports noticing less joint pain now than before. Pt states she changed milk from Lactaid 2% milk to almond milk.   Pt states she has been reading labels to keep fat and sugar in single digits per serving. Pt states she is drinking at least 64 ounces of fluid a day. Pt states she has eliminated coffee from routine. Pt states she helps take care of her mom some days after work; mom's surgery went well.   Per insurance, pt needs 6 SWL visits prior to surgery.  MEDICATIONS: See list   DIETARY INTAKE:  24-hr recall:  B ( AM): peanut butter + bagel or protein shake, boiled eggs  Snk ( AM): sometimes protein shake  L ( PM): grilled chicken wrap + small fruit cup or salad with steak or chicken Snk ( PM): none D ( PM): cauliflower rice with garlic rice, stir fry peas and carrots, chicken/salmon Snk ( PM): none Beverages: water, green tea, chai  Usual physical  activity: walking 30 min, 3x/week   Diet to Follow: 1600 calories 180 g carbohydrates 120 g protein 44 g fat  Preferred Learning Style:   No preference indicated   Learning Readiness:   Ready  Change in progress     Nutritional Diagnosis:  [place]-3.3 Overweight/obesity related to past poor dietary habits and physical inactivity as evidenced by patient w/ planned sleeve gastrectomy surgery following dietary guidelines for continued weight loss.    Intervention:  Nutrition counseling for upcoming Bariatric Surgery. Pt was counseled on ways to eat breakfast mindfully without rushing. Pt was also educated on the importance of chewing well and being more conscious of not drinking around times of eating.  Goals:  - Aim to eat breakfast at home before leaving to go to work.  - Continue to work on chewing well.  - Be more conscious of not drinking 15 minutes before eating, not while eating, and waiting 30 minutes after eating to drink.  Teaching Method Utilized:  Visual Auditory Hands on  Handouts given during visit include:  none  Barriers to learning/adherence to lifestyle change: none identified  Demonstrated degree of understanding via:  Teach Back   Monitoring/Evaluation:  Dietary intake, exercise, and body weight in 1 month(s).

## 2018-03-25 ENCOUNTER — Encounter: Payer: Self-pay | Admitting: Registered"

## 2018-03-25 ENCOUNTER — Encounter: Payer: BLUE CROSS/BLUE SHIELD | Attending: General Surgery | Admitting: Registered"

## 2018-03-25 DIAGNOSIS — R7303 Prediabetes: Secondary | ICD-10-CM | POA: Insufficient documentation

## 2018-03-25 DIAGNOSIS — E669 Obesity, unspecified: Secondary | ICD-10-CM

## 2018-03-25 DIAGNOSIS — Z713 Dietary counseling and surveillance: Secondary | ICD-10-CM | POA: Diagnosis not present

## 2018-03-25 NOTE — Progress Notes (Signed)
Sleeve Gastrectomy Appt start time: 4:15 end time: 4:30  Assessment: 5th SWL Appointment.   Start Wt at [facility]: 284.8 Wt: 272.4 BMI: 48.06   Pt arrives having maintained weight from previous visit.  Pt states avoiding drinking around meals is not hard but still working on chewing well. Pt states her son graduated from high school last weekend; leaves for college 07/07.  Pt states she has goal of 10,000 steps on Fitbit, achieves 3 times a week when unable to walk for physical activity. Pt states she has noticed her taste buds have changed and she she is not craving things she used to eat.   Pt states her joints feel better; overall she feels better. Pt states she would prefer to get up early and walk because she likes being out in the "morning air". Pt states she sleeps 11pm-5:30 (6.5 hrs) nightly. Pt states it is easy for her to not drink around meals; doing well with this goal. Pt states she does well with chewing when she is not rushing. Pt reports eating breakfast in the car on the way to work or once she gets to work (which is rushed). Pt states she is going on vacation to the [place] for 2 weeks in June/July.    Pt states she has included some carbohydrates recently. Pt states she has been using the stairs more. Pt states she is still working on chewing well, does well some days, forgets on other days. Pt states she tries not to eat after 7-8 pm. Pt states she is not a snacker.   Pt reports noticing less joint pain now than before. Pt states she changed milk from Lactaid 2% milk to almond milk.   Pt states she has been reading labels to keep fat and sugar in single digits per serving. Pt states she is drinking at least 64 ounces of fluid a day. Pt states she has eliminated coffee from routine. Pt states she helps take care of her mom some days after work; mom's surgery went well.   Per insurance, pt needs 6 SWL visits prior to surgery.  MEDICATIONS: See list   DIETARY INTAKE:  24-hr  recall:  B ( AM): peanut butter + bagel or protein shake, boiled eggs  Snk ( AM): sometimes protein shake  L ( PM): grilled chicken wrap + small fruit cup or salad with steak or chicken Snk ( PM): none D ( PM): cauliflower rice with garlic rice, stir fry peas and carrots, chicken/salmon Snk ( PM): none Beverages: water, green tea, chai  Usual physical activity: walking 30 min, 3x/week   Diet to Follow: 1600 calories 180 g carbohydrates 120 g protein 44 g fat  Preferred Learning Style:   No preference indicated   Learning Readiness:   Ready  Change in progress     Nutritional Diagnosis:  [place]-3.3 Overweight/obesity related to past poor dietary habits and physical inactivity as evidenced by patient w/ planned sleeve gastrectomy surgery following dietary guidelines for continued weight loss.    Intervention:  Nutrition counseling for upcoming Bariatric Surgery.  Goals:  - Aim to eat breakfast at home before leaving to go to work. Practice mindful eating.  - Take multivitamin complete and Vitamin D 5000 IU daily. - Keep up the great work!  Teaching Method Utilized:  Visual Auditory Hands on  Handouts given during visit include:  none  Barriers to learning/adherence to lifestyle change: none identified  Demonstrated degree of understanding via:  Teach Back   Monitoring/Evaluation:  Dietary intake, exercise, and body weight in 1 month(s).

## 2018-03-25 NOTE — Patient Instructions (Addendum)
-   Aim to eat breakfast at home before leaving to go to work. Practice mindful eating.   - Take multivitamin complete and Vitamin D 5000 IU daily.  - Keep up the great work!

## 2018-04-23 ENCOUNTER — Encounter: Payer: BLUE CROSS/BLUE SHIELD | Attending: General Surgery | Admitting: Registered"

## 2018-04-23 ENCOUNTER — Encounter: Payer: Self-pay | Admitting: Registered"

## 2018-04-23 DIAGNOSIS — R7303 Prediabetes: Secondary | ICD-10-CM | POA: Diagnosis not present

## 2018-04-23 DIAGNOSIS — Z713 Dietary counseling and surveillance: Secondary | ICD-10-CM | POA: Diagnosis not present

## 2018-04-23 DIAGNOSIS — E669 Obesity, unspecified: Secondary | ICD-10-CM

## 2018-04-23 NOTE — Progress Notes (Signed)
Sleeve Gastrectomy Appt start time: 8:04 end time: 8:23  Assessment: 6th SWL Appointment  Start Wt at [facility]: 284.8 Wt: 270.5 BMI: 47.92   Pt arrives having lost about 1.9  lbs from previous visit.  Pt states she has been trying to get up earlier and able to cook breakfast this morning. Pt states she has been more active: moving more, takes stairs more often, dancing, and making an effort to be more active. Pt states she also plans to do more swimming at neighborhood pool.  Pt states she has been avoiding drinking around meals is not hard but still working on chewing well. Pt states her son has transitioned to college. Pt states she has noticed her taste buds have changed and she she is not craving things she used to eat.   Pt states her joints feel better; overall she feels better. Pt states she would prefer to get up early and walk because she likes being out in the "morning air". Pt states she sleeps 11pm-5:30 (6.5 hrs) nightly. Pt states it is easy for her to not drink around meals; doing well with this goal. Pt states she does well with chewing when she is not rushing. Pt reports eating breakfast in the car on the way to work or once she gets to work (which is rushed). Pt states she is going on vacation to the [place] for 2 weeks in June/July.    Pt states she has included some carbohydrates recently. Pt states she has been using the stairs more. Pt states she is still working on chewing well, does well some days, forgets on other days. Pt states she tries not to eat after 7-8 pm. Pt states she is not a snacker.   Pt reports noticing less joint pain now than before. Pt states she changed milk from Lactaid 2% milk to almond milk.   Pt states she has been reading labels to keep fat and sugar in single digits per serving. Pt states she is drinking at least 64 ounces of fluid a day. Pt states she has eliminated coffee from routine. Pt states she helps take care of her mom some days after work;  mom's surgery went well.   Per insurance, pt needs 6 SWL visits prior to surgery.  MEDICATIONS: See list   DIETARY INTAKE:  24-hr recall:  B ( AM): peanut butter + bagel or protein shake, boiled eggs  Snk ( AM): sometimes protein shake  L ( PM): grilled chicken wrap + small fruit cup or salad with steak or chicken Snk ( PM): none D ( PM): cauliflower rice with garlic rice, stir fry peas and carrots, chicken/salmon Snk ( PM): none Beverages: water, green tea, chai  Usual physical activity: walking 30 min, 3-5x/week   Diet to Follow: 1600 calories 180 g carbohydrates 120 g protein 44 g fat  Preferred Learning Style:   No preference indicated   Learning Readiness:   Ready  Change in progress     Nutritional Diagnosis:  [place]-3.3 Overweight/obesity related to past poor dietary habits and physical inactivity as evidenced by patient w/ planned sleeve gastrectomy surgery following dietary guidelines for continued weight loss.    Intervention:  Nutrition counseling for upcoming Bariatric Surgery.  Goals:  - Aim to eat breakfast at home before leaving to go to work. Practice mindful eating.  - Keep up the great work!  Teaching Method Utilized:  Visual Auditory Hands on  Handouts given during visit include:  none  Barriers to learning/adherence  to lifestyle change: none identified  Demonstrated degree of understanding via:  Teach Back   Monitoring/Evaluation:  Dietary intake, exercise, and body weight prn.

## 2018-04-23 NOTE — Patient Instructions (Addendum)
-   Aim to eat breakfast at home before leaving to go to work. Practice mindful eating.   - Keep up the great work!

## 2018-05-18 ENCOUNTER — Ambulatory Visit: Payer: Self-pay

## 2018-05-31 IMAGING — CR DG CHEST 2V
3 series · 3 of 3 positions shown · non-contrast
Comparison: 01/21/2014

CLINICAL DATA: Morbid obesity, preop gastric bypass history of
hypertension

EXAM:
CHEST  2 VIEW

[w chest pa]
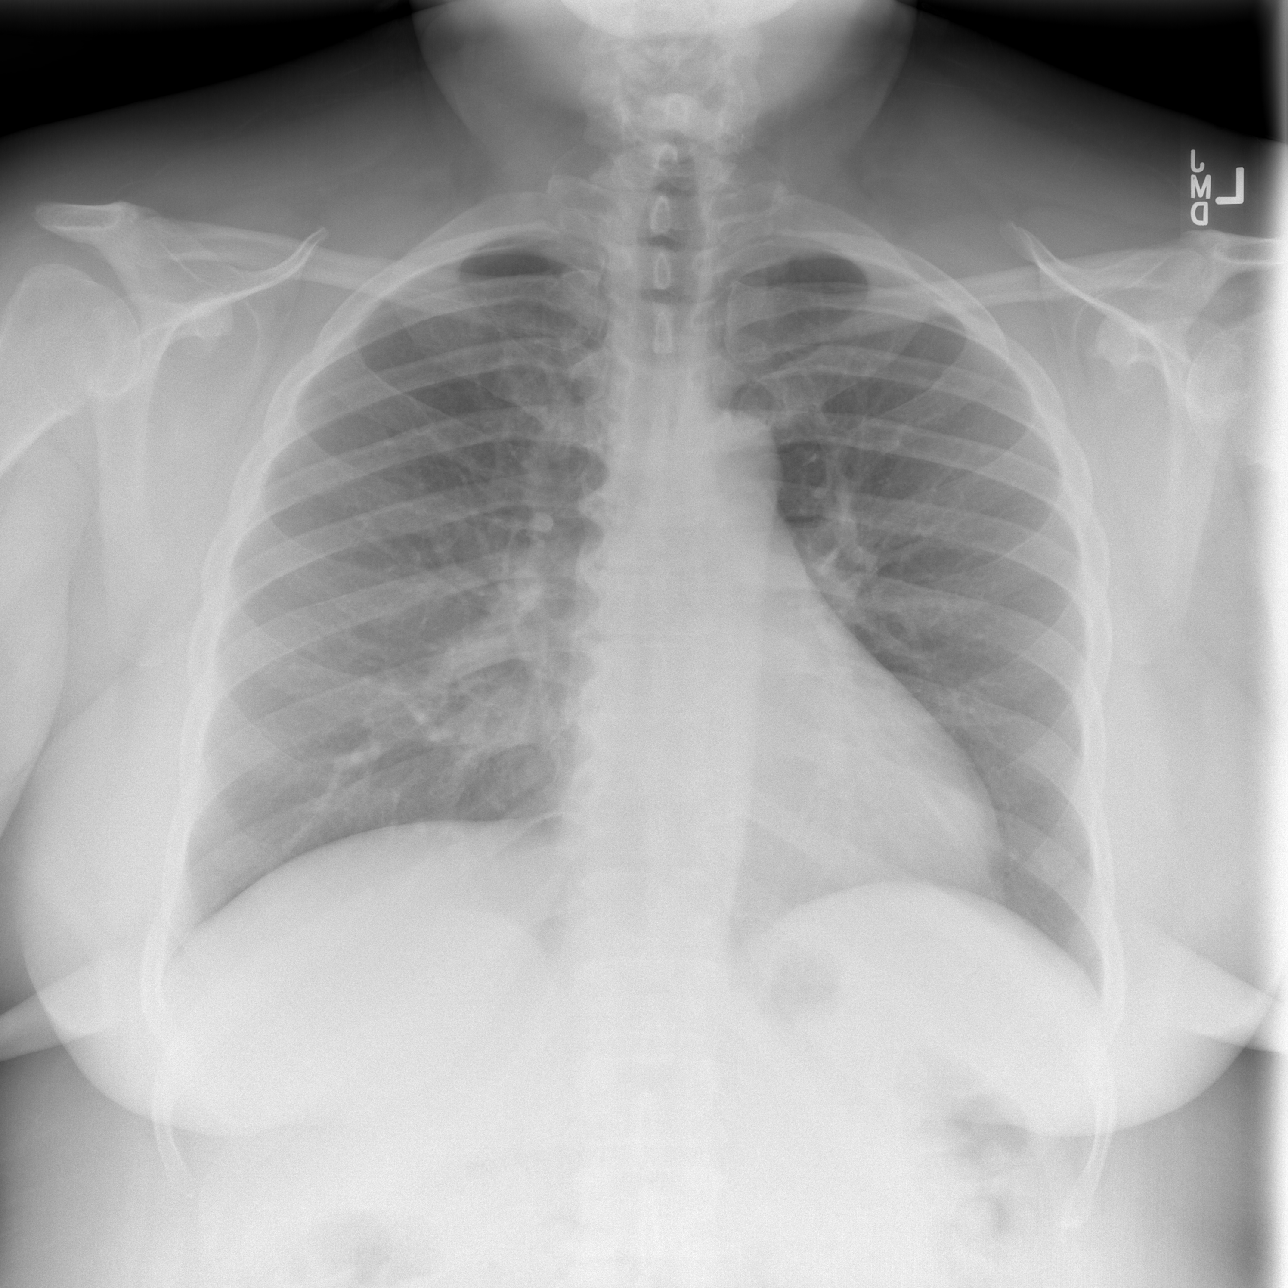

[w chest lat]
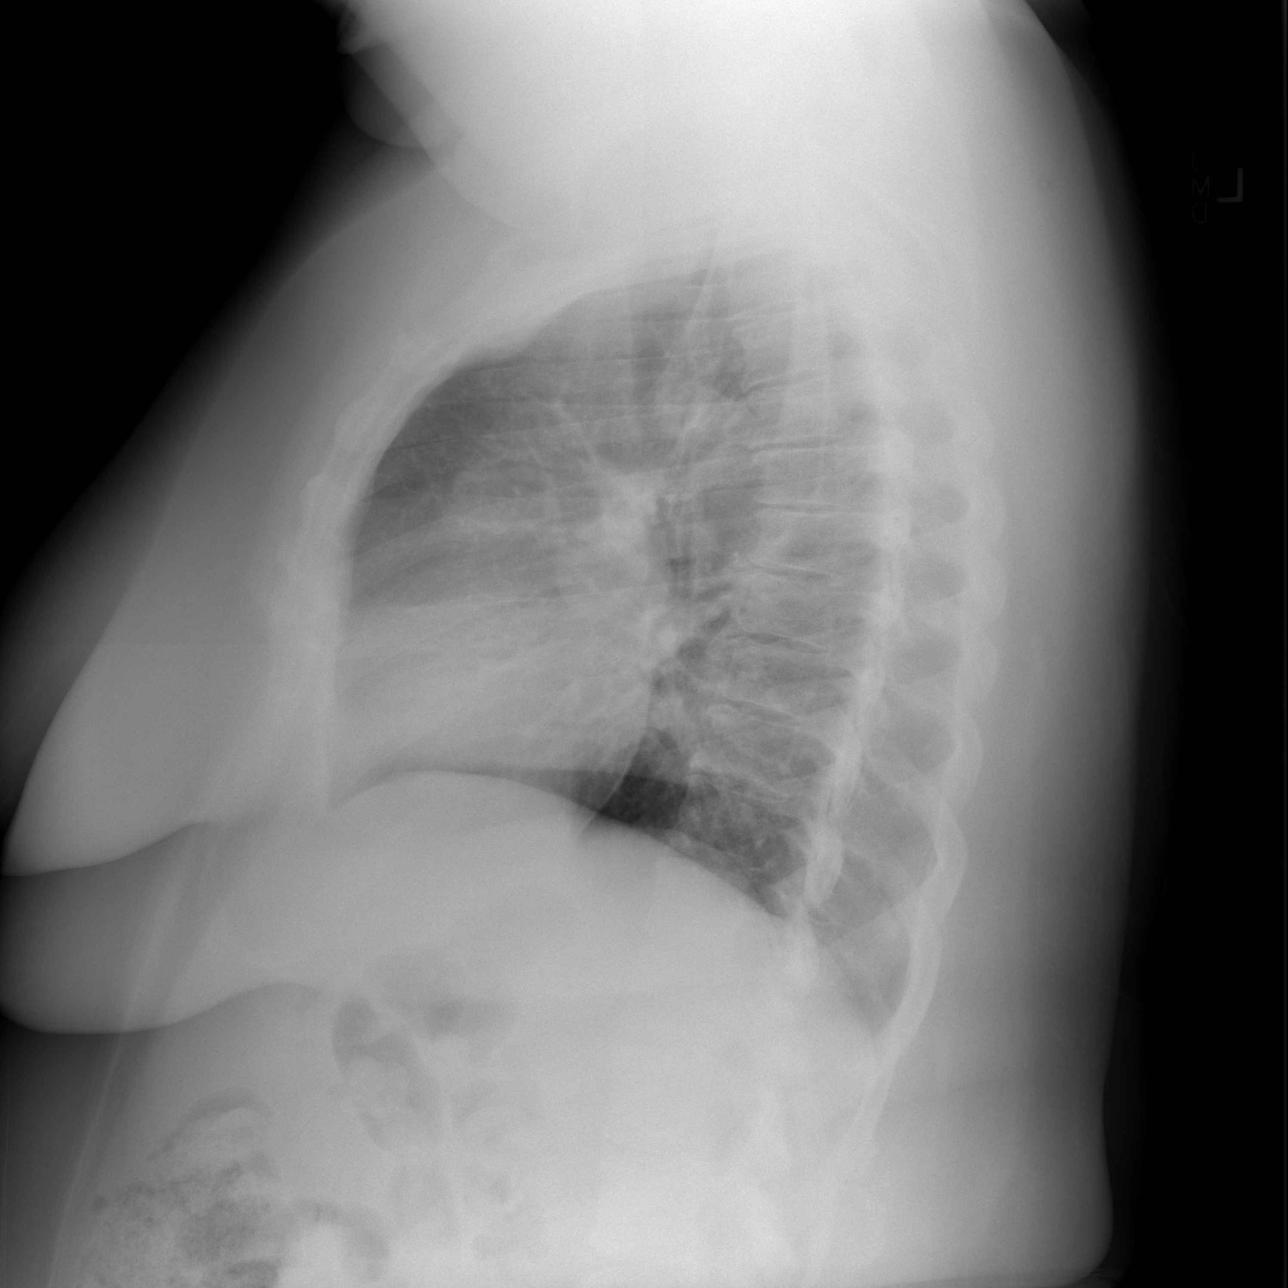

[w chest lat *]
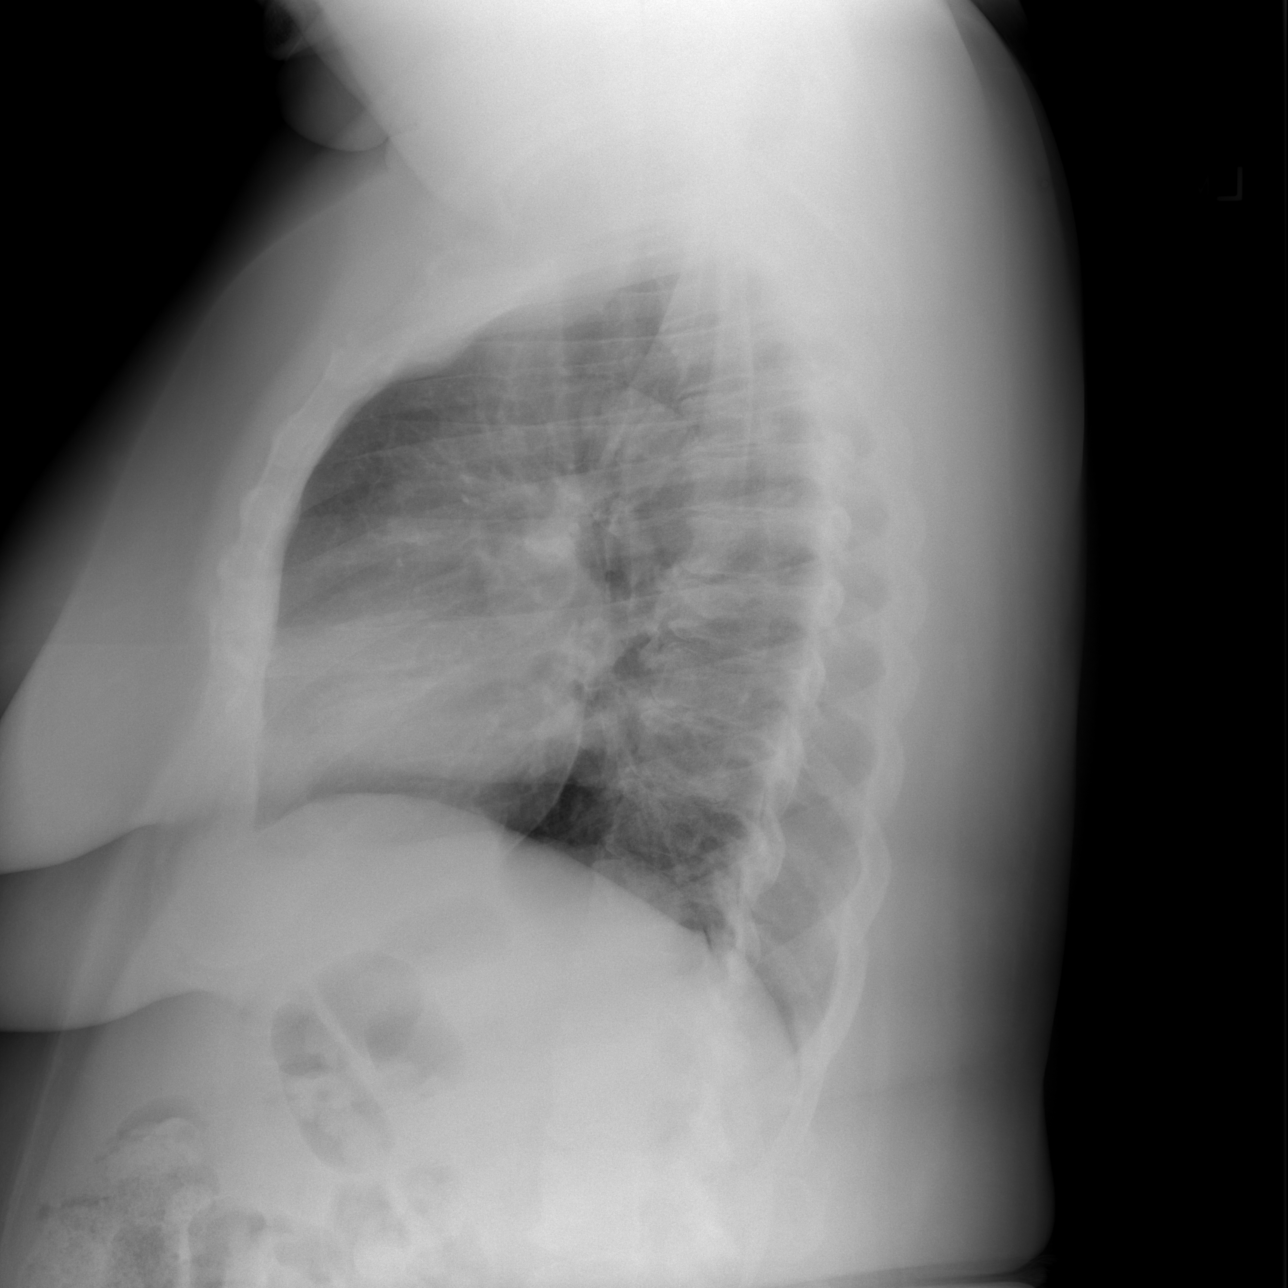

[3 of 3 positions shown; findings below may reference images not displayed]

FINDINGS: The heart size and mediastinal contours are within normal limits.
Both lungs are clear. Mild degenerative changes of the spine.
IMPRESSION: No active cardiopulmonary disease.

## 2018-06-01 ENCOUNTER — Ambulatory Visit: Payer: Self-pay | Admitting: Registered"
